# Patient Record
Sex: Female | Born: 1980 | Race: White | Hispanic: No | Marital: Married | Smoking: Current every day smoker
Health system: Southern US, Community
[De-identification: ages and names within clinical notes are randomized; demographics above are authoritative.]

## PROBLEM LIST (undated history)

## (undated) DIAGNOSIS — Z8041 Family history of malignant neoplasm of ovary: Secondary | ICD-10-CM

## (undated) DIAGNOSIS — G43909 Migraine, unspecified, not intractable, without status migrainosus: Secondary | ICD-10-CM

## (undated) DIAGNOSIS — F32A Depression, unspecified: Secondary | ICD-10-CM

## (undated) DIAGNOSIS — Z8719 Personal history of other diseases of the digestive system: Secondary | ICD-10-CM

## (undated) DIAGNOSIS — J40 Bronchitis, not specified as acute or chronic: Secondary | ICD-10-CM

## (undated) DIAGNOSIS — K760 Fatty (change of) liver, not elsewhere classified: Secondary | ICD-10-CM

## (undated) DIAGNOSIS — F329 Major depressive disorder, single episode, unspecified: Secondary | ICD-10-CM

## (undated) DIAGNOSIS — D649 Anemia, unspecified: Secondary | ICD-10-CM

## (undated) DIAGNOSIS — K219 Gastro-esophageal reflux disease without esophagitis: Secondary | ICD-10-CM

## (undated) HISTORY — PX: ABDOMINAL HYSTERECTOMY: SHX81

## (undated) HISTORY — DX: Family history of malignant neoplasm of ovary: Z80.41

## (undated) HISTORY — PX: DILATION AND CURETTAGE OF UTERUS: SHX78

## (undated) HISTORY — PX: WISDOM TOOTH EXTRACTION: SHX21

## (undated) HISTORY — PX: PARTIAL HYSTERECTOMY: SHX80

---

## 2004-08-28 ENCOUNTER — Emergency Department: Payer: Self-pay | Admitting: General Practice

## 2004-12-03 ENCOUNTER — Emergency Department: Payer: Self-pay | Admitting: General Practice

## 2005-10-14 ENCOUNTER — Emergency Department: Payer: Self-pay | Admitting: Emergency Medicine

## 2006-03-28 ENCOUNTER — Ambulatory Visit: Payer: Self-pay | Admitting: Internal Medicine

## 2006-09-21 ENCOUNTER — Emergency Department: Payer: Self-pay | Admitting: Emergency Medicine

## 2008-02-15 ENCOUNTER — Ambulatory Visit: Payer: Self-pay | Admitting: Unknown Physician Specialty

## 2008-04-05 ENCOUNTER — Ambulatory Visit: Payer: Self-pay | Admitting: Unknown Physician Specialty

## 2010-07-21 ENCOUNTER — Emergency Department: Payer: Self-pay | Admitting: Emergency Medicine

## 2012-07-28 ENCOUNTER — Ambulatory Visit: Payer: Self-pay | Admitting: Unknown Physician Specialty

## 2013-05-06 DIAGNOSIS — Z803 Family history of malignant neoplasm of breast: Secondary | ICD-10-CM

## 2013-05-06 DIAGNOSIS — Z1371 Encounter for nonprocreative screening for genetic disease carrier status: Secondary | ICD-10-CM

## 2013-05-06 HISTORY — DX: Family history of malignant neoplasm of breast: Z80.3

## 2013-05-06 HISTORY — DX: Encounter for nonprocreative screening for genetic disease carrier status: Z13.71

## 2013-05-24 ENCOUNTER — Emergency Department: Payer: Self-pay | Admitting: Emergency Medicine

## 2013-05-24 LAB — COMPREHENSIVE METABOLIC PANEL
ALT: 41 U/L (ref 12–78)
ANION GAP: 8 (ref 7–16)
AST: 34 U/L (ref 15–37)
Albumin: 4.1 g/dL (ref 3.4–5.0)
Alkaline Phosphatase: 90 U/L
BUN: 10 mg/dL (ref 7–18)
Bilirubin,Total: 0.4 mg/dL (ref 0.2–1.0)
CHLORIDE: 107 mmol/L (ref 98–107)
Calcium, Total: 9.9 mg/dL (ref 8.5–10.1)
Co2: 22 mmol/L (ref 21–32)
Creatinine: 0.96 mg/dL (ref 0.60–1.30)
GLUCOSE: 102 mg/dL — AB (ref 65–99)
Osmolality: 273 (ref 275–301)
POTASSIUM: 4.4 mmol/L (ref 3.5–5.1)
SODIUM: 137 mmol/L (ref 136–145)
Total Protein: 8.7 g/dL — ABNORMAL HIGH (ref 6.4–8.2)

## 2013-05-24 LAB — URINALYSIS, COMPLETE
Bilirubin,UR: NEGATIVE
GLUCOSE, UR: NEGATIVE mg/dL (ref 0–75)
Nitrite: NEGATIVE
Ph: 5 (ref 4.5–8.0)
Protein: 100
RBC,UR: 754 /HPF (ref 0–5)
Specific Gravity: 1.027 (ref 1.003–1.030)
Squamous Epithelial: 17
WBC UR: 30 /HPF (ref 0–5)

## 2013-05-24 LAB — CBC WITH DIFFERENTIAL/PLATELET
BASOS ABS: 0 10*3/uL (ref 0.0–0.1)
BASOS PCT: 0.3 %
EOS ABS: 0.1 10*3/uL (ref 0.0–0.7)
Eosinophil %: 0.8 %
HCT: 44.4 % (ref 35.0–47.0)
HGB: 14.9 g/dL (ref 12.0–16.0)
LYMPHS ABS: 2.5 10*3/uL (ref 1.0–3.6)
Lymphocyte %: 18 %
MCH: 28.8 pg (ref 26.0–34.0)
MCHC: 33.7 g/dL (ref 32.0–36.0)
MCV: 86 fL (ref 80–100)
Monocyte #: 0.6 x10 3/mm (ref 0.2–0.9)
Monocyte %: 4.7 %
Neutrophil #: 10.4 10*3/uL — ABNORMAL HIGH (ref 1.4–6.5)
Neutrophil %: 76.2 %
Platelet: 303 10*3/uL (ref 150–440)
RBC: 5.18 10*6/uL (ref 3.80–5.20)
RDW: 14 % (ref 11.5–14.5)
WBC: 13.7 10*3/uL — ABNORMAL HIGH (ref 3.6–11.0)

## 2013-12-20 ENCOUNTER — Ambulatory Visit: Payer: Self-pay

## 2014-10-13 ENCOUNTER — Other Ambulatory Visit: Payer: Self-pay | Admitting: Family Medicine

## 2014-10-13 DIAGNOSIS — L723 Sebaceous cyst: Secondary | ICD-10-CM

## 2014-10-19 ENCOUNTER — Ambulatory Visit: Admission: RE | Admit: 2014-10-19 | Payer: 59 | Source: Ambulatory Visit

## 2014-10-24 ENCOUNTER — Ambulatory Visit: Payer: 59

## 2014-11-02 ENCOUNTER — Ambulatory Visit: Payer: 59

## 2017-01-02 DIAGNOSIS — J029 Acute pharyngitis, unspecified: Secondary | ICD-10-CM | POA: Insufficient documentation

## 2017-01-02 NOTE — ED Triage Notes (Signed)
Patient c/o sore throat beginning today. Pt has taken tylenol with no relief, last dose at 1800.

## 2017-01-03 ENCOUNTER — Emergency Department
Admission: EM | Admit: 2017-01-03 | Discharge: 2017-01-03 | Disposition: A | Discharge status: Home / Self Care | Payer: BLUE CROSS/BLUE SHIELD | Attending: Emergency Medicine | Admitting: Emergency Medicine

## 2017-01-03 DIAGNOSIS — J020 Streptococcal pharyngitis: Secondary | ICD-10-CM

## 2017-01-03 HISTORY — DX: Depression, unspecified: F32.A

## 2017-01-03 HISTORY — DX: Migraine, unspecified, not intractable, without status migrainosus: G43.909

## 2017-01-03 HISTORY — DX: Major depressive disorder, single episode, unspecified: F32.9

## 2017-01-03 LAB — POCT RAPID STREP A: STREPTOCOCCUS, GROUP A SCREEN (DIRECT): NEGATIVE

## 2017-01-03 MED ORDER — PENICILLIN G BENZATHINE & PROC 1200000 UNIT/2ML IM SUSP
2.4000 10*6.[IU] | Freq: Once | INTRAMUSCULAR | Status: AC
  Administered 2017-01-03: 2.4 10*6.[IU] via INTRAMUSCULAR
  Filled 2017-01-03: qty 4

## 2017-01-03 MED ORDER — IBUPROFEN 800 MG PO TABS: 800.0000 mg | ORAL_TABLET | Freq: Three times a day (TID) | ORAL | 0 refills | Status: DC | PRN

## 2017-01-03 MED ORDER — KETOROLAC TROMETHAMINE 10 MG PO TABS
10.0000 mg | ORAL_TABLET | Freq: Once | ORAL | Status: AC
  Administered 2017-01-03: 10 mg via ORAL
  Filled 2017-01-03: qty 1

## 2017-01-03 MED ORDER — AMOXICILLIN-POT CLAVULANATE 875-125 MG PO TABS: 1.0000 | ORAL_TABLET | Freq: Two times a day (BID) | ORAL | 0 refills | Status: AC

## 2017-01-03 MED ORDER — LIDOCAINE VISCOUS 2 % MT SOLN
15.0000 mL | Freq: Once | OROMUCOSAL | Status: AC
  Administered 2017-01-03: 15 mL via OROMUCOSAL
  Filled 2017-01-03: qty 15

## 2017-01-03 NOTE — ED Provider Notes (Signed)
[facility] Emergency Department Provider Note    First MD Initiated Contact with Patient 01/03/17 [phone]     (approximate)  I have reviewed the triage vital signs and the nursing notes.   HISTORY  Chief Complaint Sore Throat    HPI Valerie Massey is a 36 y.o. female presents to the emergency department with sore throat bilateral neck pain with onset yesterday.patient states that the pain is worse with any swallowing. Patient denies any fever afebrile on presentation temperature 90.9. Patient denies any vomiting. Patient denies any cough or congestion.   Past Medical History:  Diagnosis Date  . Depression   . Migraines     There are no active problems to display for this patient.   Past Surgical History:  Procedure Laterality Date  . WISDOM TOOTH EXTRACTION      Prior to Admission medications   Not on File    Allergies Bee venom  No family history on file.  Social History Social History  Substance Use Topics  . Smoking status: Not on file  . Smokeless tobacco: Not on file  . Alcohol use Not on file    Review of Systems Constitutional: No fever/chills Eyes: No visual changes. ENT: Positive for sore throat. Cardiovascular: Denies chest pain. Respiratory: Denies shortness of breath. Gastrointestinal: No abdominal pain.  No nausea, no vomiting.  No diarrhea.  No constipation. Genitourinary: Negative for dysuria. Musculoskeletal: Negative for neck pain.  Negative for back pain. Integumentary: Negative for rash. Neurological: Negative for headaches, focal weakness or numbness.   ____________________________________________   PHYSICAL EXAM:  VITAL SIGNS: ED Triage Vitals  Enc Vitals Group     BP 01/02/17 2323 (!) 156/84     Pulse Rate 01/02/17 2323 71     Resp 01/02/17 2323 16     Temp 01/02/17 2323 99 F (37.2 C)     Temp Source 01/02/17 2323 Oral     SpO2 01/02/17 2323 95 %     Weight 01/02/17 2324 97.5 kg (215 lb)     Height 01/02/17 2324 1.676 m (5\' 6" )     Head Circumference --      Peak Flow --      Pain Score 01/02/17 2327 5     Pain Loc --      Pain Edu? --    Constitutional: Alert and oriented. Well appearing and in no acute distress. Eyes: Conjunctivae are normal.  Head: Atraumatic. Mouth/Throat: Mucous membranes are moist.bilateral tonsillar erythema with exudate. Normal non-muffled speech Neck: No stridor. Palpable submandibular and anterior cervical lymphadenopathy. Cardiovascular: Normal rate, regular rhythm. Good peripheral circulation. Grossly normal heart sounds. Respiratory: Normal respiratory effort.  No retractions. Lungs CTAB. Gastrointestinal: Soft and nontender. No distention.  Musculoskeletal: No lower extremity tenderness nor edema. No gross deformities of extremities. Neurologic:  Normal speech and language. No gross focal neurologic deficits are appreciated.  Skin:  Skin is warm, dry and intact. No rash noted.   ____________________________________________   LABS (all labs ordered are listed, but only abnormal results are displayed)  Labs Reviewed  CULTURE, GROUP A STREP [facility])  POCT RAPID STREP A   ____________________________________________   Procedures   ____________________________________________   INITIAL IMPRESSION / ASSESSMENT AND PLAN / ED COURSE  Pertinent labs & imaging results that were available during my care of the patient were reviewed by me and considered in my medical decision making (see chart for details).  36 year old female presenting with history of physical exam consistent with strep  throat. Patient received penicillin G emergency department will be prescribed Augmentin for home.      ____________________________________________  FINAL CLINICAL IMPRESSION(S) / ED DIAGNOSES  Final diagnoses:  Strep throat     MEDICATIONS GIVEN DURING THIS VISIT:  Medications  penicillin g procaine-penicillin g benzathine (BICILLIN-CR)  injection 600000-600000 units (2.4 Million Units Intramuscular Given 01/03/17 0344)  lidocaine (XYLOCAINE) 2 % viscous mouth solution 15 mL (15 mLs Mouth/Throat Given 01/03/17 0343)  ketorolac (TORADOL) tablet 10 mg (10 mg Oral Given 01/03/17 0343)     NEW OUTPATIENT MEDICATIONS STARTED DURING THIS VISIT:  New Prescriptions   No medications on file    Modified Medications   No medications on file    Discontinued Medications   No medications on file     Note:  This document was prepared using Dragon voice recognition software and may include unintentional dictation errors.    Gregor Hams, MD 01/03/17 [phone]

## 2017-01-03 NOTE — ED Notes (Signed)
ED Provider at bedside. 

## 2017-01-03 NOTE — ED Notes (Signed)

## 2017-01-05 LAB — CULTURE, GROUP A STREP (THRC)

## 2017-01-28 LAB — HM PAP SMEAR: HM PAP: NEGATIVE

## 2017-11-07 ENCOUNTER — Emergency Department: Payer: Self-pay

## 2017-11-07 ENCOUNTER — Emergency Department
Admission: EM | Admit: 2017-11-07 | Discharge: 2017-11-08 | Disposition: A | Discharge status: Home / Self Care | Payer: Self-pay | Attending: Emergency Medicine | Admitting: Emergency Medicine

## 2017-11-07 ENCOUNTER — Other Ambulatory Visit: Payer: Self-pay

## 2017-11-07 DIAGNOSIS — M545 Low back pain, unspecified: Secondary | ICD-10-CM

## 2017-11-07 DIAGNOSIS — W109XXA Fall (on) (from) unspecified stairs and steps, initial encounter: Secondary | ICD-10-CM | POA: Insufficient documentation

## 2017-11-07 DIAGNOSIS — S29012A Strain of muscle and tendon of back wall of thorax, initial encounter: Secondary | ICD-10-CM | POA: Insufficient documentation

## 2017-11-07 DIAGNOSIS — S322XXA Fracture of coccyx, initial encounter for closed fracture: Secondary | ICD-10-CM | POA: Insufficient documentation

## 2017-11-07 DIAGNOSIS — Y9301 Activity, walking, marching and hiking: Secondary | ICD-10-CM | POA: Insufficient documentation

## 2017-11-07 DIAGNOSIS — Y929 Unspecified place or not applicable: Secondary | ICD-10-CM | POA: Insufficient documentation

## 2017-11-07 DIAGNOSIS — Y999 Unspecified external cause status: Secondary | ICD-10-CM | POA: Insufficient documentation

## 2017-11-07 LAB — POCT PREGNANCY, URINE: Preg Test, Ur: NEGATIVE

## 2017-11-07 NOTE — ED Triage Notes (Signed)
Pt states she fell down 3 steps approx 45 min pta injuring lower and mid back. Pt cms intact to toes. Pt states she believes she has bruising to back. Pt complains of pain with sitting.

## 2017-11-07 NOTE — ED Notes (Addendum)
Pt still unable to urinate, but reports will attempt att

## 2017-11-07 NOTE — ED Provider Notes (Signed)
[facility] Emergency Department Provider Note ____________________________________________  Time seen: Approximately 10:35 PM  I have reviewed the triage vital signs and the nursing notes.   HISTORY  Chief Complaint Fall    HPI Valerie Massey is a 37 y.o. female who presents to the emergency department for evaluation and treatment of lower and mid back pain aftera mechanical, non-syncopal fall earlier this evening.  She states that she was running out of the house and the stairs were slick, she slipped and her feet went out from underneath her and she landed on her back.  The middle part of her back struck while the stairs and then she bounced down the next step on her buttocks.  No alleviating measures have been attempted prior to arrival.  She did not have any loss of consciousness.  She has had no subsequent loss of bowel or bladder control.  Past Medical History:  Diagnosis Date  . Depression   . Migraines     There are no active problems to display for this patient.   Past Surgical History:  Procedure Laterality Date  . WISDOM TOOTH EXTRACTION      Prior to Admission medications   Medication Sig Start Date End Date Taking? Authorizing Provider  cyclobenzaprine (FLEXERIL) 10 MG tablet Take 1 tablet (10 mg total) by mouth 3 (three) times daily as needed for muscle spasms. 11/08/17   Grethel Zenk B, FNP  ibuprofen (ADVIL,MOTRIN) 800 MG tablet Take 1 tablet (800 mg total) by mouth every 8 (eight) hours as needed. 01/03/17   Gregor Hams, MD  naproxen (NAPROSYN) 500 MG tablet Take 1 tablet (500 mg total) by mouth 2 (two) times daily with a meal. 11/08/17   Laurene Melendrez B, FNP    Allergies Bee venom  No family history on file.  Social History Social History   Tobacco Use  . Smoking status: Not on file  Substance Use Topics  . Alcohol use: Not on file  . Drug use: Not on file    Review of Systems Constitutional: Negative for  fever. Cardiovascular: Negative for chest pain. Respiratory: Negative for shortness of breath. Musculoskeletal: Positive for back pain Skin: Positive for abrasion Neurological: Negative for decrease in sensation  ____________________________________________   PHYSICAL EXAM:  VITAL SIGNS: ED Triage Vitals  Enc Vitals Group     BP 11/07/17 2202 136/85     Pulse Rate 11/07/17 2202 100     Resp 11/07/17 2202 16     Temp 11/07/17 2202 98.7 F (37.1 C)     Temp Source 11/07/17 2202 Oral     SpO2 11/07/17 2202 96 %     Weight 11/07/17 2203 260 lb (117.9 kg)     Height 11/07/17 2203 5\' 6"  (1.676 m)     Head Circumference --      Peak Flow --      Pain Score 11/07/17 2203 8     Pain Loc --      Pain Edu? --      Excl. in [place]? --     Constitutional: Alert and oriented. Well appearing and in no acute distress. Eyes: Conjunctivae are clear without discharge or drainage Head: Atraumatic Neck: Supple.  Nexus criteria is negative. Respiratory: No cough. Respirations are even and unlabored. Musculoskeletal: Diffuse thoracic, lumbar, and sacral tenderness without focal bony abnormality. Neurologic: Awake, alert, oriented x4.  Motor and sensory function is intact. Skin: Horizontal abrasion consistent with patient's report of injury on the thoracic  Psychiatric: Affect and behavior are appropriate.  ____________________________________________   LABS (all labs ordered are listed, but only abnormal results are displayed)  Labs Reviewed  POCT PREGNANCY, URINE  POC URINE PREG, ED   ____________________________________________  RADIOLOGY  Images of the thoracic and lumbar spine are negative for acute bony abnormality.  Image of the sacrum and coccyx show a subtle distal coccyx fracture. ____________________________________________   PROCEDURES  Procedures  ____________________________________________   INITIAL IMPRESSION / ASSESSMENT AND PLAN / ED COURSE  Valerie OLLIS  is a 37 y.o. who presents to the emergency department for treatment and evaluation after a mechanical, non-syncopal fall earlier today.  Images and exam are consistent.  She will be treated with Flexeril and Naprosyn for a subtle coccyx fracture.  She was provided a work note for the next 2 days as well.  She is to follow-up with the primary care provider for symptoms that are not improving over the next week or so.  She was advised to return to the emergency department for symptoms that change or worsen if unable to schedule an appointment.  Medications  cyclobenzaprine (FLEXERIL) tablet 10 mg (10 mg Oral Given 11/08/17 0021)  naproxen (NAPROSYN) tablet 500 mg (500 mg Oral Given 11/08/17 0115)    Pertinent labs & imaging results that were available during my care of the patient were reviewed by me and considered in my medical decision making (see chart for details).  _________________________________________   FINAL CLINICAL IMPRESSION(S) / ED DIAGNOSES  Final diagnoses:  Acute lumbar back pain  Fracture of coccyx, initial encounter for closed fracture ([place])  Strain of thoracic back region    ED Discharge Orders        Ordered    cyclobenzaprine (FLEXERIL) 10 MG tablet  3 times daily PRN     11/08/17 0020    naproxen (NAPROSYN) 500 MG tablet  2 times daily with meals     11/08/17 0020       If controlled substance prescribed during this visit, 12 month history viewed on the [place] prior to issuing an initial prescription for Schedule II or III opiod.    Victorino Dike, FNP 11/08/17 0030    Nance Pear, MD 11/08/17 1501

## 2017-11-08 MED ORDER — NAPROXEN 500 MG PO TABS
500.0000 mg | ORAL_TABLET | Freq: Once | ORAL | Status: AC
  Administered 2017-11-08: 500 mg via ORAL

## 2017-11-08 MED ORDER — CYCLOBENZAPRINE HCL 10 MG PO TABS: 10.0000 mg | ORAL_TABLET | Freq: Three times a day (TID) | ORAL | 0 refills | Status: DC | PRN

## 2017-11-08 MED ORDER — NAPROXEN 500 MG PO TABS
ORAL_TABLET | ORAL | Status: AC
  Administered 2017-11-08: 500 mg via ORAL
  Filled 2017-11-08: qty 1

## 2017-11-08 MED ORDER — NAPROXEN 500 MG PO TABS: 500.0000 mg | ORAL_TABLET | Freq: Two times a day (BID) | ORAL | 0 refills | Status: DC

## 2017-11-08 MED ORDER — CYCLOBENZAPRINE HCL 10 MG PO TABS
ORAL_TABLET | ORAL | Status: AC
  Administered 2017-11-08: 10 mg via ORAL
  Filled 2017-11-08: qty 1

## 2017-11-08 MED ORDER — CYCLOBENZAPRINE HCL 10 MG PO TABS
10.0000 mg | ORAL_TABLET | Freq: Once | ORAL | Status: AC
  Administered 2017-11-08: 10 mg via ORAL

## 2017-11-08 NOTE — Discharge Instructions (Addendum)
Ice 20 minutes per hour while awake. Do not take the flexeril if you are working or driving. Follow up with the PCP in 1 week if not improving. Return to the ER for symptoms that change or worsen or for new concerns if unable to schedule an appointment.

## 2017-11-08 NOTE — ED Notes (Signed)
Pt returned from DG att 

## 2018-01-28 ENCOUNTER — Ambulatory Visit (INDEPENDENT_AMBULATORY_CARE_PROVIDER_SITE_OTHER): Payer: Self-pay | Admitting: Obstetrics and Gynecology

## 2018-01-28 ENCOUNTER — Encounter: Payer: Self-pay | Admitting: Obstetrics and Gynecology

## 2018-01-28 ENCOUNTER — Other Ambulatory Visit: Payer: Self-pay

## 2018-01-28 VITALS — BP 140/80 | HR 96 | Ht 66.0 in | Wt 249.0 lb

## 2018-01-28 DIAGNOSIS — Z124 Encounter for screening for malignant neoplasm of cervix: Secondary | ICD-10-CM

## 2018-01-28 DIAGNOSIS — Z1331 Encounter for screening for depression: Secondary | ICD-10-CM

## 2018-01-28 DIAGNOSIS — Z3202 Encounter for pregnancy test, result negative: Secondary | ICD-10-CM

## 2018-01-28 DIAGNOSIS — Z01419 Encounter for gynecological examination (general) (routine) without abnormal findings: Secondary | ICD-10-CM

## 2018-01-28 DIAGNOSIS — Z1339 Encounter for screening examination for other mental health and behavioral disorders: Secondary | ICD-10-CM

## 2018-01-28 DIAGNOSIS — N939 Abnormal uterine and vaginal bleeding, unspecified: Secondary | ICD-10-CM

## 2018-01-28 DIAGNOSIS — Z113 Encounter for screening for infections with a predominantly sexual mode of transmission: Secondary | ICD-10-CM

## 2018-01-28 DIAGNOSIS — N921 Excessive and frequent menstruation with irregular cycle: Secondary | ICD-10-CM

## 2018-01-28 LAB — POCT URINE PREGNANCY: Preg Test, Ur: NEGATIVE

## 2018-01-28 LAB — HM PAP SMEAR: HM Pap smear: NORMAL

## 2018-01-28 MED ORDER — MEDROXYPROGESTERONE ACETATE 10 MG PO TABS: 10.0000 mg | ORAL_TABLET | Freq: Every day | ORAL | 0 refills | Status: DC

## 2018-01-28 NOTE — Progress Notes (Signed)
Gynecology Annual Exam  PCP: [facility], [facility]  Chief Complaint  Patient presents with  . Gynecologic Exam    abnormal bleeding x1 month   History of Present Illness:  Ms. Valerie Massey is a 37 y.o. G0P0000 who LMP was No LMP recorded., presents today for her annual examination.  Her menses are irregular. She bleeds most days.  Dysmenorrhea none. She notes bleeding after intercourse and the last time it was severe. This occurred last week.  Of note, the patient had an endometrial biopsy in 2016 that showed a benign endometrial polyp and was otherwise benign.  The polyp has not been follow up since that time.   She is single partner, contraception - none.  Last Pap: 11/2013  Results were: no abnormalities /neg HPV DNA negative Hx of STDs: none  Last mammogram: n/a There is a FH of breast cancer in her mother at age 63. There is a FH of ovarian cancer ("borderline"). Her maternal grandmother had endometrial cancer, age unsure. The patient does do self-breast exams.  Tobacco use: smokes 1/2 ppd for the past 20 years. Alcohol use: social drinker Exercise: not active  The patient wears seatbelts: yes.      Past Medical History:  Diagnosis Date  . Depression   . Migraines    Past Surgical History:  Procedure Laterality Date  . WISDOM TOOTH EXTRACTION     Prior to Admission medications   Medication Sig Start Date End Date Taking? Authorizing Provider  acetaminophen (TYLENOL) 500 MG tablet Take 500 mg by mouth every 6 (six) hours as needed for headache.   Yes [provider]  omeprazole (PRILOSEC) 40 MG capsule Take 40 mg by mouth daily.   Yes [provider]    Allergies  Allergen Reactions  . Bee Venom Hives and Swelling    Gynecologic History: unsure  Obstetric History: G0P0000  Social History   Socioeconomic History  . Marital status: Single    Spouse name: Not on file  . Number of children: Not on file  . Years of education:  Not on file  . Highest education level: Not on file  Occupational History  . Occupation: Investment banker, operational  . Financial resource strain: Not on file  . Food insecurity:    Worry: Not on file    Inability: Not on file  . Transportation needs:    Medical: Not on file    Non-medical: Not on file  Tobacco Use  . Smoking status: Current Every Day Smoker    Packs/day: 0.50    Types: Cigarettes    Start date: 05/06/1997  . Smokeless tobacco: Never Used  Substance and Sexual Activity  . Alcohol use: Yes    Comment: 1 q month  . Drug use: Never  . Sexual activity: Yes    Partners: Male    Birth control/protection: None  Lifestyle  . Physical activity:    Days per week: 0 days    Minutes per session: Not on file  . Stress: Not on file  Relationships  . Social connections:    Talks on phone: Not on file    Gets together: Not on file    Attends religious service: Not on file    Active member of club or organization: Not on file    Attends meetings of clubs or organizations: Not on file    Relationship status: Not on file  . Intimate partner violence:    Fear of current or ex  partner: Not on file    Emotionally abused: Not on file    Physically abused: Not on file    Forced sexual activity: Not on file  Other Topics Concern  . Not on file  Social History Narrative  . Not on file    Family History  Problem Relation Age of Onset  . Breast cancer Mother 66  . Ovarian cancer Mother 82  . Endometrial cancer Maternal Grandmother 23  . Lung cancer Maternal Grandfather 60  . Lung cancer Paternal Grandfather 49    Review of Systems  Constitutional: Negative.   HENT: Negative.   Eyes: Negative.   Respiratory: Negative.   Cardiovascular: Negative.   Gastrointestinal: Negative.   Genitourinary: Negative.   Musculoskeletal: Negative.   Skin: Negative.   Neurological: Negative.   Psychiatric/Behavioral: Negative.      Physical Exam BP 140/80 (BP Location: Left Arm,  Patient Position: Sitting, Cuff Size: Normal)   Pulse 96   Ht 5\' 6"  (1.676 m)   Wt 249 lb (112.9 kg)   BMI 40.19 kg/m    Physical Exam  Constitutional: She is oriented to person, place, and time. She appears well-developed and well-nourished. No distress.  Genitourinary: Uterus normal. Pelvic exam was performed with patient supine. There is no rash, tenderness, lesion or injury on the right labia. There is no rash, tenderness, lesion or injury on the left labia. There is bleeding in the vagina. No erythema or tenderness in the vagina. No signs of injury around the vagina. No vaginal discharge found. Right adnexum does not display mass, does not display tenderness and does not display fullness. Left adnexum does not display mass, does not display tenderness and does not display fullness. Cervix does not exhibit motion tenderness, lesion, discharge or polyp.   Uterus is mobile and anteverted. Uterus is not enlarged, tender or exhibiting a mass.  HENT:  Head: Normocephalic and atraumatic.  Eyes: EOM are normal. No scleral icterus.  Neck: Normal range of motion. Neck supple. No thyromegaly present.  Cardiovascular: Normal rate and regular rhythm. Exam reveals no gallop and no friction rub.  No murmur heard. Pulmonary/Chest: Effort normal and breath sounds normal. No respiratory distress. She has no wheezes. She has no rales. Right breast exhibits no inverted nipple, no mass, no nipple discharge, no skin change and no tenderness. Left breast exhibits no inverted nipple, no mass, no nipple discharge, no skin change and no tenderness.  Abdominal: Soft. Bowel sounds are normal. She exhibits no distension and no mass. There is no tenderness. There is no rebound and no guarding.  Musculoskeletal: Normal range of motion. She exhibits no edema or tenderness.  Lymphadenopathy:    She has no cervical adenopathy.       Right: No inguinal adenopathy present.       Left: No inguinal adenopathy present.   Neurological: She is alert and oriented to person, place, and time. No cranial nerve deficit.  Skin: Skin is warm and dry. No rash noted. No erythema.  Psychiatric: She has a normal mood and affect. Her behavior is normal. Judgment normal.   Endometrial Biopsy After discussion with the patient regarding her abnormal uterine bleeding I recommended that she proceed with an endometrial biopsy for further diagnosis. The risks, benefits, alternatives, and indications for an endometrial biopsy were discussed with the patient in detail. She understood the risks including infection, bleeding, cervical laceration and uterine perforation.  Verbal consent was obtained.   PROCEDURE NOTE:  Pipelle endometrial biopsy was performed  using aseptic technique with iodine preparation.  The uterus was sounded to a length of 8 cm.  Adequate sampling was obtained with minimal blood loss.  The patient tolerated the procedure well.  Disposition will be pending pathology.  Female chaperone present for pelvic and breast  portions of the physical exam  Assessment: 37 y.o. G0P0000 female here for routine annual gynecologic examination.  Plan: Problem List Items Addressed This Visit      Other   Menorrhagia with irregular cycle - Primary   Relevant Medications   medroxyPROGESTERone (PROVERA) 10 MG tablet   Other Relevant Orders   US PELVIS TRANSVANGINAL NON-OB (TV ONLY)   Pathology    Other Visit Diagnoses    Abnormal uterine bleeding       Relevant Medications   medroxyPROGESTERone (PROVERA) 10 MG tablet   Other Relevant Orders   POCT urine pregnancy (Completed)   US PELVIS TRANSVANGINAL NON-OB (TV ONLY)   Pathology   Pap smear for cervical cancer screening       Screen for STD (sexually transmitted disease)       Women's annual routine gynecological examination       Screening for depression       Screening for alcoholism          Screening: -- Blood pressure screen elevated: continued to  monitor. -- Colonoscopy - not due -- Mammogram - not due -- Weight screening: obese: discussed management options, including lifestyle, dietary, and exercise. -- Depression screening negative (PHQ-9) -- Nutrition: normal -- cholesterol screening: not due for screening -- osteoporosis screening: not due -- tobacco screening: using: discussed quitting using the 5 A's -- alcohol screening: AUDIT questionnaire indicates low-risk usage. -- family history of breast cancer screening: done. not at high risk. -- no evidence of domestic violence or intimate partner violence. -- STD screening: gonorrhea/chlamydia NAAT collected -- pap smear collected per ASCCP guidelines  Menorrhagia with irregular cycle (abnormal uterine bleeding): endometrial biopsy taken today. Pap smear also performed.  Will obtain pelvic ultrasound. Discussed past pathology result.  She may still have an endometrial polyp.    20 minutes spent in face to face discussion with > 50% spent in counseling,management, and coordination of care of her menorrhagia with irregular cycle.   Prentice Docker, MD 01/28/2018 6:39 PM

## 2018-02-02 LAB — PATHOLOGY

## 2018-02-10 ENCOUNTER — Encounter: Payer: Self-pay | Admitting: Obstetrics and Gynecology

## 2018-02-10 ENCOUNTER — Ambulatory Visit (INDEPENDENT_AMBULATORY_CARE_PROVIDER_SITE_OTHER): Payer: Self-pay

## 2018-02-10 ENCOUNTER — Ambulatory Visit (INDEPENDENT_AMBULATORY_CARE_PROVIDER_SITE_OTHER): Payer: Self-pay | Admitting: Obstetrics and Gynecology

## 2018-02-10 VITALS — BP 118/78 | Ht 66.0 in | Wt 249.0 lb

## 2018-02-10 DIAGNOSIS — N939 Abnormal uterine and vaginal bleeding, unspecified: Secondary | ICD-10-CM

## 2018-02-10 DIAGNOSIS — N921 Excessive and frequent menstruation with irregular cycle: Secondary | ICD-10-CM

## 2018-02-10 DIAGNOSIS — N840 Polyp of corpus uteri: Secondary | ICD-10-CM

## 2018-02-10 MED ORDER — MEDROXYPROGESTERONE ACETATE 10 MG PO TABS: 10.0000 mg | ORAL_TABLET | Freq: Every day | ORAL | 0 refills | Status: DC

## 2018-02-10 NOTE — Progress Notes (Signed)
Gynecology Ultrasound Follow Up   Chief Complaint  Patient presents with  . Follow-up  ultrasound follow up for abnormal uterine bleeding   History of Present Illness: Patient is a 37 y.o. female who presents today for ultrasound evaluation of the above .  Ultrasound demonstrates the following findings Adnexa: no masses seen  Uterus: anteverted with endometrial stripe  15.6 mm Additional: small stalk of flow feeding into the endometrium, no obvious polyp seen.   Pap smear (MDL): NILM, HPV negative Endometrial biopsy: disordered proliferative phase endometrium. Fragments with features suggestive of benign endometrial polyp. No atypical hyperplasia or carcinoma.   Past Medical History:  Diagnosis Date  . Depression   . Migraines     Past Surgical History:  Procedure Laterality Date  . WISDOM TOOTH EXTRACTION      Family History  Problem Relation Age of Onset  . Breast cancer Mother 87  . Ovarian cancer Mother 38  . Endometrial cancer Maternal Grandmother 40  . Lung cancer Maternal Grandfather 60  . Lung cancer Paternal Grandfather 79    Social History   Socioeconomic History  . Marital status: Single    Spouse name: Not on file  . Number of children: Not on file  . Years of education: Not on file  . Highest education level: Not on file  Occupational History  . Occupation: Investment banker, operational  . Financial resource strain: Not on file  . Food insecurity:    Worry: Not on file    Inability: Not on file  . Transportation needs:    Medical: Not on file    Non-medical: Not on file  Tobacco Use  . Smoking status: Current Every Day Smoker    Packs/day: 0.50    Types: Cigarettes    Start date: 05/06/1997  . Smokeless tobacco: Never Used  Substance and Sexual Activity  . Alcohol use: Yes    Comment: 1 q month  . Drug use: Never  . Sexual activity: Yes    Partners: Male    Birth control/protection: None  Lifestyle  . Physical activity:    Days per week:  0 days    Minutes per session: Not on file  . Stress: Not on file  Relationships  . Social connections:    Talks on phone: Not on file    Gets together: Not on file    Attends religious service: Not on file    Active member of club or organization: Not on file    Attends meetings of clubs or organizations: Not on file    Relationship status: Not on file  . Intimate partner violence:    Fear of current or ex partner: Not on file    Emotionally abused: Not on file    Physically abused: Not on file    Forced sexual activity: Not on file  Other Topics Concern  . Not on file  Social History Narrative  . Not on file    Allergies  Allergen Reactions  . Bee Venom Hives and Swelling    Prior to Admission medications   Medication Sig Start Date End Date Taking? Authorizing Provider  acetaminophen (TYLENOL) 500 MG tablet Take 500 mg by mouth every 6 (six) hours as needed for headache.    [provider]  cyclobenzaprine (FLEXERIL) 10 MG tablet Take 1 tablet (10 mg total) by mouth 3 (three) times daily as needed for muscle spasms. Patient not taking: Reported on 01/28/2018 11/08/17   Victorino Dike, FNP  ibuprofen (ADVIL,MOTRIN) 800 MG tablet Take 1 tablet (800 mg total) by mouth every 8 (eight) hours as needed. Patient not taking: Reported on 01/28/2018 01/03/17   Gregor Hams, MD  medroxyPROGESTERone (PROVERA) 10 MG tablet Take 1 tablet (10 mg total) by mouth daily for 10 days. 01/28/18 02/07/18  Will Bonnet, MD  naproxen (NAPROSYN) 500 MG tablet Take 1 tablet (500 mg total) by mouth 2 (two) times daily with a meal. Patient not taking: Reported on 01/28/2018 11/08/17   Sherrie George B, FNP  omeprazole (PRILOSEC) 40 MG capsule Take 40 mg by mouth daily.    [provider]    Physical Exam BP 118/78   Ht 5\' 6"  (1.676 m)   Wt 249 lb (112.9 kg)   BMI 40.19 kg/m    General: NAD HEENT: normocephalic, anicteric Pulmonary: No increased work of  breathing Extremities: no edema, erythema, or tenderness Neurologic: Grossly intact, normal gait Psychiatric: mood appropriate, affect full  Imaging Results: US Pelvis Transvanginal Non-ob (tv Only)  Result Date: 02/10/2018 Patient Name: Valerie Massey DOB: 1981/01/30 MRN: [id number] ULTRASOUND REPORT Location: [facility] Date of Service: 02/10/2018 Indications: Abnormal Uterine Bleeding Findings: The uterus is anteverted and measures 6.8 x 4.7 x 4.1cm. Echo texture is homogenous without evidence of focal masses. The Endometrium is thickened with cystic areas within measuring 15.6 mm. Stalk of flow seen feeding into the endometrium. No obvious polyp seen, but cannot rule it out. Right Ovary measures 3.4 x 2.8 x 2.9 cm. It is normal in appearance. Left Ovary measures 3.1 x 3.0 x 1.8 cm. It is normal in appearance. Survey of the adnexa demonstrates no adnexal masses. There is no free fluid in the cul de sac. Impression: 1. Thickened, heterogeneous endometrium with small stalk of flow seen. No obvious polyp by ultrasound, but it cannot be ruled out. 2. Uterus otherwise unremarkable 3. Ovaries unremarkable. Vita Barley, RDMS RVT The ultrasound images and findings were reviewed by me and I agree with the above report. Prentice Docker, MD, [facility], [facility] 02/10/2018 4:06 PM       Assessment: 37 y.o. G0P0000  1. Menorrhagia with irregular cycle   2. Endometrial polyp      Plan: Problem List Items Addressed This Visit      Genitourinary   Endometrial polyp   Relevant Medications   medroxyPROGESTERone (PROVERA) 10 MG tablet     Other   Menorrhagia with irregular cycle - Primary   Relevant Medications   medroxyPROGESTERone (PROVERA) 10 MG tablet     Discussed findings from endometrial biopsy, Pap smear, and ultrasound.  Recommended surgery to remove very likely polyp.  She agrees with this recommendation and we will proceed.  20 minutes spent in face to  face discussion with > 50% spent in counseling,management, and coordination of care of her menorrhagia with irregular cycle and endometrial polyp.  Prentice Docker, MD, [facility], [facility] 02/10/2018 4:31 PM

## 2018-02-11 ENCOUNTER — Telehealth: Payer: Self-pay | Admitting: Obstetrics and Gynecology

## 2018-02-11 NOTE — Telephone Encounter (Signed)
-----   Message from Will Bonnet, MD sent at 02/10/2018  4:31 PM EDT ----- Regarding: Schedule surgery Surgery Booking Request Patient Full Name:  Valerie Massey  MRN: [id number]  DOB: [birth year]  Surgeon: Prentice Docker, MD  Requested Surgery Date and Time: TBD Primary Diagnosis AND Code: endometrial polyp, menorrhagia with irregular menses Secondary Diagnosis and Code:  Surgical Procedure: hysteroscopy, dilation and curettage, polypectomy L&D Notification: No Admission Status: same day surgery Length of Surgery: 35 minutes Special Case Needs: myosure H&P: tbd (date) Phone Interview???: no Interpreter: Language:  Medical Clearance: no Special Scheduling Instructions: none

## 2018-02-11 NOTE — Telephone Encounter (Signed)
Patient is aware of H&P at [facility] on 02/25/18 @ 10:50am w/ Dr. Glennon Mac, Pre-admit Testing afterwards, and OR on 03/12/18. Patient is aware she may receive calls from the [facility] and [facility]. Patient confirmed she is self pay. Patient was given the following phone#s: [phone] [facility] Patient [place], [phone] [phone] to apply for financial assistance within [facility] practices, and [place]. Ext given.

## 2018-02-19 ENCOUNTER — Telehealth: Payer: Self-pay

## 2018-02-19 NOTE — Telephone Encounter (Signed)
Pt states hydroxyprogesterone is not working for her bleeding.  States SDJ told her to let him know and maybe he could increase the dose.  [phone].

## 2018-02-20 ENCOUNTER — Other Ambulatory Visit: Payer: Self-pay | Admitting: Obstetrics and Gynecology

## 2018-02-20 DIAGNOSIS — N921 Excessive and frequent menstruation with irregular cycle: Secondary | ICD-10-CM

## 2018-02-20 DIAGNOSIS — N840 Polyp of corpus uteri: Secondary | ICD-10-CM

## 2018-02-20 MED ORDER — MEDROXYPROGESTERONE ACETATE 10 MG PO TABS: 20.0000 mg | ORAL_TABLET | Freq: Two times a day (BID) | ORAL | 0 refills | Status: DC

## 2018-02-25 ENCOUNTER — Encounter
Admission: RE | Admit: 2018-02-25 | Discharge: 2018-02-25 | Disposition: A | Discharge status: Home / Self Care | Payer: Self-pay | Source: Ambulatory Visit | Attending: Obstetrics and Gynecology | Admitting: Obstetrics and Gynecology

## 2018-02-25 ENCOUNTER — Other Ambulatory Visit: Payer: Self-pay

## 2018-02-25 ENCOUNTER — Ambulatory Visit (INDEPENDENT_AMBULATORY_CARE_PROVIDER_SITE_OTHER): Payer: Self-pay | Admitting: Obstetrics and Gynecology

## 2018-02-25 ENCOUNTER — Encounter: Payer: Self-pay | Admitting: Obstetrics and Gynecology

## 2018-02-25 VITALS — BP 122/78 | Ht 66.0 in | Wt 247.0 lb

## 2018-02-25 DIAGNOSIS — Z01812 Encounter for preprocedural laboratory examination: Secondary | ICD-10-CM | POA: Insufficient documentation

## 2018-02-25 DIAGNOSIS — N840 Polyp of corpus uteri: Secondary | ICD-10-CM

## 2018-02-25 DIAGNOSIS — N921 Excessive and frequent menstruation with irregular cycle: Secondary | ICD-10-CM

## 2018-02-25 HISTORY — DX: Personal history of other diseases of the digestive system: Z87.19

## 2018-02-25 HISTORY — DX: Anemia, unspecified: D64.9

## 2018-02-25 LAB — CBC
HCT: 39 % (ref 36.0–46.0)
Hemoglobin: 12.6 g/dL (ref 12.0–15.0)
MCH: 27.6 pg (ref 26.0–34.0)
MCHC: 32.3 g/dL (ref 30.0–36.0)
MCV: 85.5 fL (ref 80.0–100.0)
NRBC: 0 % (ref 0.0–0.2)
PLATELETS: 316 10*3/uL (ref 150–400)
RBC: 4.56 MIL/uL (ref 3.87–5.11)
RDW: 14.3 % (ref 11.5–15.5)
WBC: 9.1 10*3/uL (ref 4.0–10.5)

## 2018-02-25 NOTE — Progress Notes (Signed)
Preoperative History and Physical  Valerie Massey is a 37 y.o. G0P0000 here for surgical management of menorrhagia with irregular cycle and abnormal uterine bleeding.   No significant preoperative concerns.  History of Present Illness: 37 y.o. G0P0000 who LMP was No LMP recorded., presents today for her annual examination.  Her menses are irregular. She bleeds most days.  Dysmenorrhea none. She notes bleeding after intercourse and the last time it was severe. This occurred last week.  Of note, the patient had an endometrial biopsy in 2016 that showed a benign endometrial polyp and was otherwise benign.  The polyp has not been follow up since that time.   Pelvic ultrasound showed (02/10/2018):  Adnexa: no masses seen  Uterus: anteverted with endometrial stripe  15.6 mm Additional: small stalk of flow feeding into the endometrium, no obvious polyp seen.   Pap smear (MDL): NILM, HPV negative Endometrial biopsy: disordered proliferative phase endometrium. Fragments with features suggestive of benign endometrial polyp. No atypical hyperplasia or carcinoma.   Proposed surgery: Hysteroscopy, dilation and curettage, polypectomy  Past Medical History:  Diagnosis Date  . Depression   . Migraines    Past Surgical History:  Procedure Laterality Date  . WISDOM TOOTH EXTRACTION     OB History  Gravida Para Term Preterm AB Living  0 0 0 0 0 0  SAB TAB Ectopic Multiple Live Births  0 0 0 0 0  Patient denies any other pertinent gynecologic issues.   Current Outpatient Medications on File Prior to Visit  Medication Sig Dispense Refill  . acetaminophen (TYLENOL) 500 MG tablet Take 1,000 mg by mouth 2 (two) times daily as needed for headache.     . calcium carbonate (TUMS - DOSED IN MG ELEMENTAL CALCIUM) 500 MG chewable tablet Chew 2 tablets by mouth daily as needed for indigestion or heartburn.    Marland Kitchen ibuprofen (ADVIL,MOTRIN) 200 MG tablet Take 400 mg by mouth 2 (two) times daily as needed for  headache or moderate pain.    . medroxyPROGESTERone (PROVERA) 10 MG tablet Take 2 tablets (20 mg total) by mouth 2 (two) times daily. 40 tablet 0  . Multiple Vitamin (MULTIVITAMIN WITH MINERALS) TABS tablet Take 1 tablet by mouth once a week.    Marland Kitchen omeprazole (PRILOSEC) 40 MG capsule Take 40 mg by mouth daily.      No current facility-administered medications on file prior to visit.    Allergies  Allergen Reactions  . Bee Venom Hives and Swelling  . Other Nausea And Vomiting    scallops    Social History:   reports that she has been smoking cigarettes. She started smoking about 20 years ago. She has been smoking about 0.50 packs per day. She has never used smokeless tobacco. She reports that she drinks alcohol. She reports that she does not use drugs.  Family History  Problem Relation Age of Onset  . Breast cancer Mother 71  . Ovarian cancer Mother 33  . Endometrial cancer Maternal Grandmother 70  . Lung cancer Maternal Grandfather 60  . Lung cancer Paternal Grandfather 21    Review of Systems: Review of Systems  Constitutional: Negative.   HENT: Negative.   Eyes: Negative.   Respiratory: Negative.   Cardiovascular: Negative.   Gastrointestinal: Negative.   Genitourinary: Negative.   Musculoskeletal: Negative.   Skin: Negative.   Neurological: Negative.   Psychiatric/Behavioral: Negative.      PHYSICAL EXAM: Blood pressure 122/78, height 5\' 6"  (1.676 m), weight 247 lb (112 kg).  CONSTITUTIONAL: Well-developed, well-nourished female in no acute distress.  HENT:  Normocephalic, atraumatic, External right and left ear normal. Oropharynx is clear and moist EYES: Conjunctivae and EOM are normal. Pupils are equal, round, and reactive to light. No scleral icterus.  NECK: Normal range of motion, supple, no masses SKIN: Skin is warm and dry. No rash noted. Not diaphoretic. No erythema. No pallor. [place]: Alert and oriented to person, place, and time. Normal reflexes, muscle tone  coordination. No cranial nerve deficit noted. PSYCHIATRIC: Normal mood and affect. Normal behavior. Normal judgment and thought content. CARDIOVASCULAR: Normal heart rate noted, regular rhythm RESPIRATORY: Effort and breath sounds normal, no problems with respiration noted ABDOMEN: Soft, nontender, nondistended. PELVIC: Deferred MUSCULOSKELETAL: Normal range of motion. No edema and no tenderness. 2+ distal pulses.  Labs: No results found for this or any previous visit (from the past 336 hour(s)).  Imaging Studies: US Pelvis Transvanginal Non-ob (tv Only)  Result Date: 02/10/2018 Patient Name: Valerie Massey DOB: 1981/04/03 MRN: [id number] ULTRASOUND REPORT Location: [facility] Date of Service: 02/10/2018 Indications: Abnormal Uterine Bleeding Findings: The uterus is anteverted and measures 6.8 x 4.7 x 4.1cm. Echo texture is homogenous without evidence of focal masses. The Endometrium is thickened with cystic areas within measuring 15.6 mm. Stalk of flow seen feeding into the endometrium. No obvious polyp seen, but cannot rule it out. Right Ovary measures 3.4 x 2.8 x 2.9 cm. It is normal in appearance. Left Ovary measures 3.1 x 3.0 x 1.8 cm. It is normal in appearance. Survey of the adnexa demonstrates no adnexal masses. There is no free fluid in the cul de sac. Impression: 1. Thickened, heterogeneous endometrium with small stalk of flow seen. No obvious polyp by ultrasound, but it cannot be ruled out. 2. Uterus otherwise unremarkable 3. Ovaries unremarkable. Vita Barley, RDMS RVT The ultrasound images and findings were reviewed by me and I agree with the above report. Prentice Docker, MD, [facility], [facility] 02/10/2018 4:06 PM      Assessment: Patient Active Problem List   Diagnosis Date Noted  . Endometrial polyp 02/10/2018  . Menorrhagia with irregular cycle 01/28/2018    Plan: Patient will undergo surgical management with hysteroscopy, dilation and  curettage, polypectomy.   The risks of surgery were discussed in detail with the patient including but not limited to: bleeding which may require transfusion or reoperation; infection which may require antibiotics; injury to surrounding organs which may involve bowel, bladder, ureters ; need for additional procedures including laparoscopy or laparotomy; thromboembolic phenomenon, surgical site problems and other postoperative/anesthesia complications. Likelihood of success in alleviating the patient's condition was discussed. Routine postoperative instructions will be reviewed with the patient and her family in detail after surgery.  The patient concurred with the proposed plan, giving informed written consent for the surgery.  Preoperative prophylactic antibiotics, as indicated, and SCDs ordered on call to the OR.    Prentice Docker, MD 02/25/2018 10:49 AM

## 2018-02-25 NOTE — Patient Instructions (Signed)
Your procedure is scheduled on:03/12/18 Report to Day Surgery.[facility] SECOND FLOOR To find out your arrival time please call [phone] between 1PM - 3PM on 03/11/18  Remember: Instructions that are not followed completely may result in serious medical risk,  up to and including death, or upon the discretion of your surgeon and anesthesiologist your  surgery may need to be rescheduled.     _X__ 1. Do not eat food after midnight the night before your procedure.                 No gum chewing or hard candies. You may drink clear liquids up to 2 hours                 before you are scheduled to arrive for your surgery- DO not drink clear                 liquids within 2 hours of the start of your surgery.                 Clear Liquids include:  water, apple juice without pulp, clear carbohydrate                 drink such as Clearfast of Gatorade, Black Coffee or Tea (Do not add                 anything to coffee or tea).  __X__2.  On the morning of surgery brush your teeth with toothpaste and water, you                may rinse your mouth with mouthwash if you wish.  Do not swallow any toothpaste of mouthwash.     _X__ 3.  No Alcohol for 24 hours before or after surgery.   _X__ 4.  Do Not Smoke or use e-cigarettes For 24 Hours Prior to Your Surgery.                 Do not use any chewable tobacco products for at least 6 hours prior to                 surgery.  ____  5.  Bring all medications with you on the day of surgery if instructed.   _X___  6.  Notify your doctor if there is any change in your medical condition      (cold, fever, infections).     Do not wear jewelry, make-up, hairpins, clips or nail polish. Do not wear lotions, powders, or perfumes. You may wear deodorant. Do not shave 48 hours prior to surgery. Men may shave face and neck. Do not bring valuables to the hospital.    [facility] is not responsible for any belongings or  valuables.  Contacts, dentures or bridgework may not be worn into surgery. Leave your suitcase in the car. After surgery it may be brought to your room. For patients admitted to the hospital, discharge time is determined by your treatment team.   Patients discharged the day of surgery will not be allowed to drive  ___X_ Take these medicines the morning of surgery with A SIP OF WATER:    1.OMEPRAZOLE AT BEDTIME 03/11/18 AND MORNING OF SURGERY  2.   3.   4.  5.  6.  ____ Fleet Enema (as directed)   ____ Use CHG Soap as directed  ____ Use inhalers on the day of surgery  ____ Stop metformin 2 days prior to surgery  ____ Take 1/2 of usual insulin dose the night before surgery. No insulin the morning          of surgery.   ____ Stop Coumadin/Plavix/aspirin on  _X___ Stop Anti-inflammatories on       STOP IBUPROFEN 03/06/18  ____ Stop supplements until after surgery.    ____ Bring C-Pap to the hospital.

## 2018-02-25 NOTE — H&P (View-Only) (Signed)
Preoperative History and Physical  Valerie Massey is a 37 y.o. G0P0000 here for surgical management of menorrhagia with irregular cycle and abnormal uterine bleeding.   No significant preoperative concerns.  History of Present Illness: 37 y.o. G0P0000 who LMP was No LMP recorded., presents today for her annual examination.  Her menses are irregular. She bleeds most days.  Dysmenorrhea none. She notes bleeding after intercourse and the last time it was severe. This occurred last week.  Of note, the patient had an endometrial biopsy in 2016 that showed a benign endometrial polyp and was otherwise benign.  The polyp has not been follow up since that time.   Pelvic ultrasound showed (02/10/2018):  Adnexa: no masses seen  Uterus: anteverted with endometrial stripe  15.6 mm Additional: small stalk of flow feeding into the endometrium, no obvious polyp seen.   Pap smear (MDL): NILM, HPV negative Endometrial biopsy: disordered proliferative phase endometrium. Fragments with features suggestive of benign endometrial polyp. No atypical hyperplasia or carcinoma.   Proposed surgery: Hysteroscopy, dilation and curettage, polypectomy  Past Medical History:  Diagnosis Date  . Depression   . Migraines    Past Surgical History:  Procedure Laterality Date  . WISDOM TOOTH EXTRACTION     OB History  Gravida Para Term Preterm AB Living  0 0 0 0 0 0  SAB TAB Ectopic Multiple Live Births  0 0 0 0 0  Patient denies any other pertinent gynecologic issues.   Current Outpatient Medications on File Prior to Visit  Medication Sig Dispense Refill  . acetaminophen (TYLENOL) 500 MG tablet Take 1,000 mg by mouth 2 (two) times daily as needed for headache.     . calcium carbonate (TUMS - DOSED IN MG ELEMENTAL CALCIUM) 500 MG chewable tablet Chew 2 tablets by mouth daily as needed for indigestion or heartburn.    Marland Kitchen ibuprofen (ADVIL,MOTRIN) 200 MG tablet Take 400 mg by mouth 2 (two) times daily as needed for  headache or moderate pain.    . medroxyPROGESTERone (PROVERA) 10 MG tablet Take 2 tablets (20 mg total) by mouth 2 (two) times daily. 40 tablet 0  . Multiple Vitamin (MULTIVITAMIN WITH MINERALS) TABS tablet Take 1 tablet by mouth once a week.    Marland Kitchen omeprazole (PRILOSEC) 40 MG capsule Take 40 mg by mouth daily.      No current facility-administered medications on file prior to visit.    Allergies  Allergen Reactions  . Bee Venom Hives and Swelling  . Other Nausea And Vomiting    scallops    Social History:   reports that she has been smoking cigarettes. She started smoking about 20 years ago. She has been smoking about 0.50 packs per day. She has never used smokeless tobacco. She reports that she drinks alcohol. She reports that she does not use drugs.  Family History  Problem Relation Age of Onset  . Breast cancer Mother 23  . Ovarian cancer Mother 42  . Endometrial cancer Maternal Grandmother 67  . Lung cancer Maternal Grandfather 60  . Lung cancer Paternal Grandfather 32    Review of Systems: Review of Systems  Constitutional: Negative.   HENT: Negative.   Eyes: Negative.   Respiratory: Negative.   Cardiovascular: Negative.   Gastrointestinal: Negative.   Genitourinary: Negative.   Musculoskeletal: Negative.   Skin: Negative.   Neurological: Negative.   Psychiatric/Behavioral: Negative.      PHYSICAL EXAM: Blood pressure 122/78, height 5\' 6"  (1.676 m), weight 247 lb (112 kg).  CONSTITUTIONAL: Well-developed, well-nourished female in no acute distress.  HENT:  Normocephalic, atraumatic, External right and left ear normal. Oropharynx is clear and moist EYES: Conjunctivae and EOM are normal. Pupils are equal, round, and reactive to light. No scleral icterus.  NECK: Normal range of motion, supple, no masses SKIN: Skin is warm and dry. No rash noted. Not diaphoretic. No erythema. No pallor. [place]: Alert and oriented to person, place, and time. Normal reflexes, muscle tone  coordination. No cranial nerve deficit noted. PSYCHIATRIC: Normal mood and affect. Normal behavior. Normal judgment and thought content. CARDIOVASCULAR: Normal heart rate noted, regular rhythm RESPIRATORY: Effort and breath sounds normal, no problems with respiration noted ABDOMEN: Soft, nontender, nondistended. PELVIC: Deferred MUSCULOSKELETAL: Normal range of motion. No edema and no tenderness. 2+ distal pulses.  Labs: No results found for this or any previous visit (from the past 336 hour(s)).  Imaging Studies: US Pelvis Transvanginal Non-ob (tv Only)  Result Date: 02/10/2018 Patient Name: Valerie Massey DOB: 09/29/1980 MRN: [id number] ULTRASOUND REPORT Location: [facility] Date of Service: 02/10/2018 Indications: Abnormal Uterine Bleeding Findings: The uterus is anteverted and measures 6.8 x 4.7 x 4.1cm. Echo texture is homogenous without evidence of focal masses. The Endometrium is thickened with cystic areas within measuring 15.6 mm. Stalk of flow seen feeding into the endometrium. No obvious polyp seen, but cannot rule it out. Right Ovary measures 3.4 x 2.8 x 2.9 cm. It is normal in appearance. Left Ovary measures 3.1 x 3.0 x 1.8 cm. It is normal in appearance. Survey of the adnexa demonstrates no adnexal masses. There is no free fluid in the cul de sac. Impression: 1. Thickened, heterogeneous endometrium with small stalk of flow seen. No obvious polyp by ultrasound, but it cannot be ruled out. 2. Uterus otherwise unremarkable 3. Ovaries unremarkable. Vita Barley, RDMS RVT The ultrasound images and findings were reviewed by me and I agree with the above report. Prentice Docker, MD, [facility], [facility] 02/10/2018 4:06 PM      Assessment: Patient Active Problem List   Diagnosis Date Noted  . Endometrial polyp 02/10/2018  . Menorrhagia with irregular cycle 01/28/2018    Plan: Patient will undergo surgical management with hysteroscopy, dilation and  curettage, polypectomy.   The risks of surgery were discussed in detail with the patient including but not limited to: bleeding which may require transfusion or reoperation; infection which may require antibiotics; injury to surrounding organs which may involve bowel, bladder, ureters ; need for additional procedures including laparoscopy or laparotomy; thromboembolic phenomenon, surgical site problems and other postoperative/anesthesia complications. Likelihood of success in alleviating the patient's condition was discussed. Routine postoperative instructions will be reviewed with the patient and her family in detail after surgery.  The patient concurred with the proposed plan, giving informed written consent for the surgery.  Preoperative prophylactic antibiotics, as indicated, and SCDs ordered on call to the OR.    Prentice Docker, MD 02/25/2018 10:49 AM

## 2018-03-03 ENCOUNTER — Telehealth: Payer: Self-pay

## 2018-03-03 NOTE — Telephone Encounter (Signed)
Pt calling triage line stating she is having surgery next Thursday with SDJ for polyp removal. SDJ gave her Provera but pre-op did not put that on her list of meds she could take. She is bleeding and wanted to know if it was ok for her to take now and not mess the surgery up.

## 2018-03-04 NOTE — Telephone Encounter (Signed)
Please let me know when pt calls back or let her know it Is fine if she takes the provera

## 2018-03-12 ENCOUNTER — Ambulatory Visit: Payer: Self-pay | Admitting: Anesthesiology

## 2018-03-12 ENCOUNTER — Ambulatory Visit
Admission: RE | Admit: 2018-03-12 | Discharge: 2018-03-12 | Disposition: A | Discharge status: Home / Self Care | Payer: Self-pay | Source: Ambulatory Visit | Attending: Obstetrics and Gynecology | Admitting: Obstetrics and Gynecology

## 2018-03-12 ENCOUNTER — Encounter
Admission: RE | Disposition: A | Discharge status: Home / Self Care | Payer: Self-pay | Source: Ambulatory Visit | Attending: Obstetrics and Gynecology

## 2018-03-12 ENCOUNTER — Other Ambulatory Visit: Payer: Self-pay

## 2018-03-12 DIAGNOSIS — Z91030 Bee allergy status: Secondary | ICD-10-CM | POA: Insufficient documentation

## 2018-03-12 DIAGNOSIS — N926 Irregular menstruation, unspecified: Secondary | ICD-10-CM | POA: Insufficient documentation

## 2018-03-12 DIAGNOSIS — Z8041 Family history of malignant neoplasm of ovary: Secondary | ICD-10-CM | POA: Insufficient documentation

## 2018-03-12 DIAGNOSIS — F329 Major depressive disorder, single episode, unspecified: Secondary | ICD-10-CM | POA: Insufficient documentation

## 2018-03-12 DIAGNOSIS — Z8049 Family history of malignant neoplasm of other genital organs: Secondary | ICD-10-CM | POA: Insufficient documentation

## 2018-03-12 DIAGNOSIS — N840 Polyp of corpus uteri: Secondary | ICD-10-CM | POA: Insufficient documentation

## 2018-03-12 DIAGNOSIS — Z803 Family history of malignant neoplasm of breast: Secondary | ICD-10-CM | POA: Insufficient documentation

## 2018-03-12 DIAGNOSIS — Z91013 Allergy to seafood: Secondary | ICD-10-CM | POA: Insufficient documentation

## 2018-03-12 DIAGNOSIS — Z79899 Other long term (current) drug therapy: Secondary | ICD-10-CM | POA: Insufficient documentation

## 2018-03-12 DIAGNOSIS — G43909 Migraine, unspecified, not intractable, without status migrainosus: Secondary | ICD-10-CM | POA: Insufficient documentation

## 2018-03-12 DIAGNOSIS — N921 Excessive and frequent menstruation with irregular cycle: Secondary | ICD-10-CM

## 2018-03-12 DIAGNOSIS — F17210 Nicotine dependence, cigarettes, uncomplicated: Secondary | ICD-10-CM | POA: Insufficient documentation

## 2018-03-12 DIAGNOSIS — K449 Diaphragmatic hernia without obstruction or gangrene: Secondary | ICD-10-CM | POA: Insufficient documentation

## 2018-03-12 DIAGNOSIS — Z6839 Body mass index (BMI) 39.0-39.9, adult: Secondary | ICD-10-CM | POA: Insufficient documentation

## 2018-03-12 DIAGNOSIS — D649 Anemia, unspecified: Secondary | ICD-10-CM | POA: Insufficient documentation

## 2018-03-12 DIAGNOSIS — Z801 Family history of malignant neoplasm of trachea, bronchus and lung: Secondary | ICD-10-CM | POA: Insufficient documentation

## 2018-03-12 HISTORY — PX: DILATATION & CURETTAGE/HYSTEROSCOPY WITH MYOSURE: SHX6511

## 2018-03-12 LAB — POCT PREGNANCY, URINE: PREG TEST UR: NEGATIVE

## 2018-03-12 SURGERY — DILATATION & CURETTAGE/HYSTEROSCOPY WITH MYOSURE
Anesthesia: General

## 2018-03-12 MED ORDER — HYDROCODONE-ACETAMINOPHEN 5-325 MG PO TABS
1.0000 | ORAL_TABLET | Freq: Once | ORAL | Status: AC
  Administered 2018-03-12: 1 via ORAL

## 2018-03-12 MED ORDER — FENTANYL CITRATE (PF) 100 MCG/2ML IJ SOLN
INTRAMUSCULAR | Status: AC
  Administered 2018-03-12: 25 ug via INTRAVENOUS
  Filled 2018-03-12: qty 2

## 2018-03-12 MED ORDER — FENTANYL CITRATE (PF) 100 MCG/2ML IJ SOLN
INTRAMUSCULAR | Status: AC
  Filled 2018-03-12: qty 2

## 2018-03-12 MED ORDER — LIDOCAINE HCL (CARDIAC) PF 100 MG/5ML IV SOSY
PREFILLED_SYRINGE | INTRAVENOUS | Status: DC | PRN
  Administered 2018-03-12: 100 mg via INTRAVENOUS

## 2018-03-12 MED ORDER — FENTANYL CITRATE (PF) 100 MCG/2ML IJ SOLN
INTRAMUSCULAR | Status: DC | PRN
  Administered 2018-03-12: 75 ug via INTRAVENOUS
  Administered 2018-03-12: 25 ug via INTRAVENOUS
  Administered 2018-03-12 (×2): 50 ug via INTRAVENOUS

## 2018-03-12 MED ORDER — HYDROCODONE-ACETAMINOPHEN 5-325 MG PO TABS
ORAL_TABLET | ORAL | Status: AC
  Administered 2018-03-12: 1 via ORAL
  Filled 2018-03-12: qty 1

## 2018-03-12 MED ORDER — HYDROCODONE-ACETAMINOPHEN 5-325 MG PO TABS: 1.0000 | ORAL_TABLET | Freq: Three times a day (TID) | ORAL | 0 refills | Status: DC | PRN

## 2018-03-12 MED ORDER — DEXAMETHASONE SODIUM PHOSPHATE 10 MG/ML IJ SOLN
INTRAMUSCULAR | Status: DC | PRN
  Administered 2018-03-12: 10 mg via INTRAVENOUS

## 2018-03-12 MED ORDER — FENTANYL CITRATE (PF) 100 MCG/2ML IJ SOLN
25.0000 ug | INTRAMUSCULAR | Status: AC | PRN
  Administered 2018-03-12 (×6): 25 ug via INTRAVENOUS

## 2018-03-12 MED ORDER — KETOROLAC TROMETHAMINE 30 MG/ML IJ SOLN
INTRAMUSCULAR | Status: DC | PRN
  Administered 2018-03-12: 30 mg via INTRAVENOUS

## 2018-03-12 MED ORDER — MIDAZOLAM HCL 2 MG/2ML IJ SOLN
INTRAMUSCULAR | Status: AC
  Filled 2018-03-12: qty 2

## 2018-03-12 MED ORDER — LACTATED RINGERS IV SOLN
INTRAVENOUS | Status: DC
  Administered 2018-03-12: 10:00:00 via INTRAVENOUS

## 2018-03-12 MED ORDER — SUGAMMADEX SODIUM 200 MG/2ML IV SOLN
INTRAVENOUS | Status: DC | PRN
  Administered 2018-03-12: 222 mg via INTRAVENOUS

## 2018-03-12 MED ORDER — IBUPROFEN 800 MG PO TABS: 800.0000 mg | ORAL_TABLET | Freq: Three times a day (TID) | ORAL | 0 refills | Status: DC | PRN

## 2018-03-12 MED ORDER — MIDAZOLAM HCL 2 MG/2ML IJ SOLN
INTRAMUSCULAR | Status: DC | PRN
  Administered 2018-03-12: 2 mg via INTRAVENOUS

## 2018-03-12 MED ORDER — HYDROMORPHONE HCL 1 MG/ML IJ SOLN
INTRAMUSCULAR | Status: AC
  Administered 2018-03-12: 0.5 mg via INTRAVENOUS
  Filled 2018-03-12: qty 1

## 2018-03-12 MED ORDER — HYDROMORPHONE HCL 1 MG/ML IJ SOLN
0.5000 mg | INTRAMUSCULAR | Status: DC | PRN
  Administered 2018-03-12 (×3): 0.5 mg via INTRAVENOUS

## 2018-03-12 MED ORDER — SILVER NITRATE-POT NITRATE 75-25 % EX MISC
CUTANEOUS | Status: AC
  Filled 2018-03-12: qty 4

## 2018-03-12 MED ORDER — PROPOFOL 10 MG/ML IV BOLUS
INTRAVENOUS | Status: AC
  Filled 2018-03-12: qty 20

## 2018-03-12 MED ORDER — ROCURONIUM BROMIDE 100 MG/10ML IV SOLN
INTRAVENOUS | Status: DC | PRN
  Administered 2018-03-12: 50 mg via INTRAVENOUS

## 2018-03-12 MED ORDER — ONDANSETRON HCL 4 MG/2ML IJ SOLN
INTRAMUSCULAR | Status: DC | PRN
  Administered 2018-03-12: 4 mg via INTRAVENOUS

## 2018-03-12 MED ORDER — PROPOFOL 10 MG/ML IV BOLUS
INTRAVENOUS | Status: DC | PRN
  Administered 2018-03-12: 200 mg via INTRAVENOUS

## 2018-03-12 MED ORDER — LACTATED RINGERS IV SOLN: INTRAVENOUS | Status: DC

## 2018-03-12 SURGICAL SUPPLY — 24 items
BAG URINE DRAINAGE (UROLOGICAL SUPPLIES) IMPLANT
CANISTER SUC SOCK COL 7IN (MISCELLANEOUS) IMPLANT
CATH FOLEY 2WAY  5CC 16FR (CATHETERS)
CATH ROBINSON RED A/P 16FR (CATHETERS) ×3 IMPLANT
CATH URTH 16FR FL 2W BLN LF (CATHETERS) IMPLANT
COVER WAND RF STERILE (DRAPES) IMPLANT
DEVICE MYOSURE LITE (MISCELLANEOUS) IMPLANT
DEVICE MYOSURE REACH (MISCELLANEOUS) IMPLANT
ELECT REM PT RETURN 9FT ADLT (ELECTROSURGICAL) ×3
ELECTRODE REM PT RTRN 9FT ADLT (ELECTROSURGICAL) ×1 IMPLANT
GLOVE BIO SURGEON STRL SZ7 (GLOVE) ×9 IMPLANT
GLOVE BIOGEL PI IND STRL 7.5 (GLOVE) ×3 IMPLANT
GLOVE BIOGEL PI INDICATOR 7.5 (GLOVE) ×6
GOWN STRL REUS W/ TWL LRG LVL3 (GOWN DISPOSABLE) ×2 IMPLANT
GOWN STRL REUS W/TWL LRG LVL3 (GOWN DISPOSABLE) ×4
IV LACTATED RINGER IRRG 3000ML (IV SOLUTION) ×2
IV LR IRRIG 3000ML ARTHROMATIC (IV SOLUTION) ×1 IMPLANT
KIT TURNOVER CYSTO (KITS) ×3 IMPLANT
PACK DNC HYST (MISCELLANEOUS) ×3 IMPLANT
PAD OB MATERNITY 4.3X12.25 (PERSONAL CARE ITEMS) ×3 IMPLANT
PAD PREP 24X41 OB/GYN DISP (PERSONAL CARE ITEMS) ×3 IMPLANT
TUBING CONNECTING 10 (TUBING) ×2 IMPLANT
TUBING CONNECTING 10' (TUBING) ×1
TUBING HYSTEROSCOPY DOLPHIN (MISCELLANEOUS) IMPLANT

## 2018-03-12 NOTE — Interval H&P Note (Signed)
History and Physical Interval Note:  03/12/2018 9:06 AM  Valerie Massey  has presented today for surgery, with the diagnosis of ENDOMETRIAL POLYP,MENORRHAGIA WITH IRREGULAR MENSES  The various methods of treatment have been discussed with the patient and family. After consideration of risks, benefits and other options for treatment, the patient has consented to  Procedure(s): [place] (N/A), as well as endometrial polypectomy as a surgical intervention .  The patient's history has been reviewed, patient examined, no change in status, stable for surgery.  I have reviewed the patient's chart and labs.  Questions were answered to the patient's satisfaction.    Prentice Docker, MD, [facility], [facility] 03/12/2018 9:06 AM  i

## 2018-03-12 NOTE — Anesthesia Preprocedure Evaluation (Signed)
Anesthesia Evaluation  Patient identified by MRN, date of birth, ID band Patient awake    Reviewed: Allergy & Precautions, H&P , NPO status , Patient's Chart, lab work & pertinent test results, reviewed documented beta blocker date and time   Airway Mallampati: II  TM Distance: >3 FB Neck ROM: full    Dental  (+) Teeth Intact   Pulmonary neg pulmonary ROS, Current Smoker,    Pulmonary exam normal        Cardiovascular Exercise Tolerance: Good negative cardio ROS Normal cardiovascular exam Rate:Normal     Neuro/Psych  Headaches, PSYCHIATRIC DISORDERS Depression negative neurological ROS  negative psych ROS   GI/Hepatic negative GI ROS, Neg liver ROS, hiatal hernia,   Endo/Other  Morbid obesity  Renal/GU negative Renal ROS  negative genitourinary   Musculoskeletal   Abdominal   Peds  Hematology negative hematology ROS (+) Blood dyscrasia, anemia ,   Anesthesia Other Findings   Reproductive/Obstetrics negative OB ROS                             Anesthesia Physical Anesthesia Plan  ASA: III  Anesthesia Plan: General LMA   Post-op Pain Management:    Induction:   PONV Risk Score and Plan:   Airway Management Planned:   Additional Equipment:   Intra-op Plan:   Post-operative Plan:   Informed Consent: I have reviewed the patients History and Physical, chart, labs and discussed the procedure including the risks, benefits and alternatives for the proposed anesthesia with the patient or authorized representative who has indicated his/her understanding and acceptance.     Plan Discussed with: CRNA  Anesthesia Plan Comments:         Anesthesia Quick Evaluation

## 2018-03-12 NOTE — Anesthesia Procedure Notes (Signed)
Procedure Name: Intubation Date/Time: 03/12/2018 10:12 AM Performed by: Philbert Riser, CRNA Pre-anesthesia Checklist: Patient identified, Emergency Drugs available, Suction available, Patient being monitored and Timeout performed Patient Re-evaluated:Patient Re-evaluated prior to induction Oxygen Delivery Method: Circle system utilized and Simple face mask Preoxygenation: Pre-oxygenation with 100% oxygen Induction Type: IV induction Ventilation: Mask ventilation without difficulty Laryngoscope Size: Mac and 3 Grade View: Grade I Tube type: Oral Tube size: 7.0 mm Number of attempts: 1 Airway Equipment and Method: Stylet Placement Confirmation: ETT inserted through vocal cords under direct vision,  positive ETCO2 and breath sounds checked- equal and bilateral Secured at: 22 cm Tube secured with: Tape Dental Injury: Teeth and Oropharynx as per pre-operative assessment

## 2018-03-12 NOTE — Transfer of Care (Signed)
Immediate Anesthesia Transfer of Care Note  Patient: Valerie Massey  Procedure(s) Performed: DILATATION & CURETTAGE/HYSTEROSCOPY WITH MYOSURE (N/A )  Patient Location: PACU  Anesthesia Type:General  Level of Consciousness: drowsy  Airway & Oxygen Therapy: Patient Spontanous Breathing and Patient connected to face mask oxygen  Post-op Assessment: Report given to RN and Post -op Vital signs reviewed and stable  Post vital signs: Reviewed and stable  Last Vitals:  Vitals Value Taken Time  BP    Temp    Pulse    Resp    SpO2      Last Pain:  Vitals:   03/12/18 0839  TempSrc: Temporal  PainSc: 0-No pain         Complications: No apparent anesthesia complications

## 2018-03-12 NOTE — Anesthesia Post-op Follow-up Note (Signed)
Anesthesia QCDR form completed.        

## 2018-03-12 NOTE — Op Note (Signed)
Operative Note   03/12/2018  PRE-OP DIAGNOSIS:  1) menorrhagia with irregular cycle 2) suspected endometrial polyp   POST-OP DIAGNOSIS:  1) menorrhagia with irregular cycle 2) suspected endometrial polyp - no evidence of discrete polyp   SURGEON: Surgeon(s) and Role:    Will Bonnet, MD - Primary  PROCEDURE: Procedure(s): 1) Hysteroscopy 2) dilation and curettage  ANESTHESIA: General  ESTIMATED BLOOD LOSS: 10 mL  DRAINS: none   TOTAL IV FLUIDS: 700 mL crystalloid  SPECIMENS:  1) endometrial lining (obtained using MyoSure resection) 2) endometrial curettings  VTE PROPHYLAXIS: SCDs to the bilateral lower extremities  ANTIBIOTICS: none indicated, none given.  FLUID DEFICIT: 191 mL  COMPLICATIONS: none  DISPOSITION: PACU - hemodynamically stable.  CONDITION: stable  INDICATION: 37 y.o. G0P0000 female with history of abnormal uterine bleeding with a previous endometrial biopsy in 2016 showing fragments of a benign endometrial polyp.  Repeat biopsy in 2019 showed fragments of benign endometrial polyp and disordered proliferative phase endometrium.    FINDINGS: Exam under anesthesia revealed small, mobile anteverted uterus with no masses and bilateral adnexa without masses or fullness, though body habitus greatly obscured physical exam. Hysteroscopy revealed a globally shaggy endometrium with an otherwise grossly normal appearing uterine cavity with bilateral tubal ostia and normal appearing endocervical canal.  No definitive polyp was identified.   PROCEDURE IN DETAIL:  After informed consent was obtained, the patient was taken to the operating room where anesthesia was obtained without difficulty. The patient was positioned in the dorsal lithotomy position in candy cane stirrups.  The patient's bladder was catheterized with an in-and-out foley catheter.  The patient was examined under anesthesia, with the above noted findings.  The bi-valved speculum was placed inside  the patient's vagina, and the the anterior lip of the cervix was seen and grasped with the tenaculum.  The cervix was progressively dilated to a 7 mm Hegar dilator.  The hysteroscope was introduced, with the above noted findings.  The MyoSure lite device was introduced through the operative port of the hysteroscope and the endometrium was sampled globally until an essentially smooth surface was obtained.  The hysteroscope and Myosure device were removed and the uterus was sampled using a curettage globally.  The hysteroscope was reintroduced to verify adequate sampling and removal of as much shaggy endometrial tissue as possible.  The pressure of the inflow fluid was gradually reduced to well below that of the MAP.  Once hemostasis was verified the hysteroscope was removed.  The tenaculum was removed and hemostasis was achieved using silver nitrate at the tenaculum entry sites.  Excellent hemostasis was noted, and all instruments were removed, with excellent hemostasis noted throughout.  She was then taken out of dorsal lithotomy.  The patient tolerated the procedure well.  Sponge, lap and needle counts were correct x2.  The patient was taken to recovery room in excellent condition.  Will Bonnet, MD, [facility] 03/12/2018 11:18 AM

## 2018-03-12 NOTE — Anesthesia Postprocedure Evaluation (Signed)
Anesthesia Post Note  Patient: Valerie Massey  Procedure(s) Performed: DILATATION & CURETTAGE/HYSTEROSCOPY WITH MYOSURE (N/A )  Patient location during evaluation: PACU Anesthesia Type: General Level of consciousness: awake and alert Pain management: pain level controlled Vital Signs Assessment: post-procedure vital signs reviewed and stable Respiratory status: spontaneous breathing, nonlabored ventilation, respiratory function stable and patient connected to nasal cannula oxygen Cardiovascular status: blood pressure returned to baseline and stable Postop Assessment: no apparent nausea or vomiting Anesthetic complications: no     Last Vitals:  Vitals:   03/12/18 1323 03/12/18 1342  BP: 132/67 138/72  Pulse: 72 70  Resp: 14   Temp: (!) 35.6 C   SpO2: 97% 97%    Last Pain:  Vitals:   03/12/18 1342  TempSrc:   PainSc: Phillips Zlata Alcaide

## 2018-03-12 NOTE — Progress Notes (Signed)
States wants to go home 

## 2018-03-12 NOTE — Discharge Instructions (Signed)

## 2018-03-13 LAB — SURGICAL PATHOLOGY

## 2018-03-16 ENCOUNTER — Telehealth: Payer: Self-pay

## 2018-03-16 NOTE — Telephone Encounter (Signed)
Called patient back. She is still cramping. She is cramping daily (at some point and it doesn't get that bad).  She denies fevers and chills.  Her bleeding is like a normal period.  She is having normal bowel and bladder function and is tolerating a regular diet. She just wanted to make sure what she was experiencing was normal.   Reassured patient. She is to call for worsening of symptoms or any new symptom of concern. She voiced understanding and agreement.

## 2018-03-16 NOTE — Telephone Encounter (Signed)
Pt called triage saying she had a hysteroscopy and D&C on Thursday, she is cramping significantly. Is it normal? How long more will this last? Also bleeding, wants to know how long she can expect these symptoms. CB# [phone]

## 2018-03-17 ENCOUNTER — Emergency Department: Payer: Self-pay

## 2018-03-17 ENCOUNTER — Other Ambulatory Visit: Payer: Self-pay

## 2018-03-17 ENCOUNTER — Emergency Department
Admission: EM | Admit: 2018-03-17 | Discharge: 2018-03-17 | Disposition: A | Discharge status: Home / Self Care | Payer: Self-pay | Attending: Emergency Medicine | Admitting: Emergency Medicine

## 2018-03-17 ENCOUNTER — Telehealth: Payer: Self-pay

## 2018-03-17 DIAGNOSIS — Z79899 Other long term (current) drug therapy: Secondary | ICD-10-CM | POA: Insufficient documentation

## 2018-03-17 DIAGNOSIS — N3001 Acute cystitis with hematuria: Secondary | ICD-10-CM | POA: Insufficient documentation

## 2018-03-17 DIAGNOSIS — F17210 Nicotine dependence, cigarettes, uncomplicated: Secondary | ICD-10-CM | POA: Insufficient documentation

## 2018-03-17 DIAGNOSIS — R109 Unspecified abdominal pain: Secondary | ICD-10-CM | POA: Insufficient documentation

## 2018-03-17 DIAGNOSIS — G8918 Other acute postprocedural pain: Secondary | ICD-10-CM | POA: Insufficient documentation

## 2018-03-17 DIAGNOSIS — N939 Abnormal uterine and vaginal bleeding, unspecified: Secondary | ICD-10-CM

## 2018-03-17 DIAGNOSIS — N938 Other specified abnormal uterine and vaginal bleeding: Secondary | ICD-10-CM | POA: Insufficient documentation

## 2018-03-17 LAB — COMPREHENSIVE METABOLIC PANEL
ALBUMIN: 4.2 g/dL (ref 3.5–5.0)
ALK PHOS: 67 U/L (ref 38–126)
ALT: 24 U/L (ref 0–44)
ANION GAP: 11 (ref 5–15)
AST: 20 U/L (ref 15–41)
BILIRUBIN TOTAL: 0.5 mg/dL (ref 0.3–1.2)
BUN: 9 mg/dL (ref 6–20)
CALCIUM: 9.4 mg/dL (ref 8.9–10.3)
CO2: 25 mmol/L (ref 22–32)
CREATININE: 0.84 mg/dL (ref 0.44–1.00)
Chloride: 103 mmol/L (ref 98–111)
GFR calc Af Amer: >60 mL/min (ref >60)
GFR calc non Af Amer: >60 mL/min (ref >60)
GLUCOSE: 148 mg/dL — AB (ref 70–99)
Potassium: 4 mmol/L (ref 3.5–5.1)
Sodium: 139 mmol/L (ref 135–145)
TOTAL PROTEIN: 8.4 g/dL — AB (ref 6.5–8.1)

## 2018-03-17 LAB — URINALYSIS, COMPLETE (UACMP) WITH MICROSCOPIC
BILIRUBIN URINE: NEGATIVE
GLUCOSE, UA: NEGATIVE mg/dL
Ketones, ur: NEGATIVE mg/dL
NITRITE: NEGATIVE
PROTEIN: 30 mg/dL — AB
SPECIFIC GRAVITY, URINE: 1.006 (ref 1.005–1.030)
pH: 7 (ref 5.0–8.0)

## 2018-03-17 LAB — CBC
HCT: 42.6 % (ref 36.0–46.0)
Hemoglobin: 13.9 g/dL (ref 12.0–15.0)
MCH: 27.5 pg (ref 26.0–34.0)
MCHC: 32.6 g/dL (ref 30.0–36.0)
MCV: 84.4 fL (ref 80.0–100.0)
PLATELETS: 365 10*3/uL (ref 150–400)
RBC: 5.05 MIL/uL (ref 3.87–5.11)
RDW: 14.2 % (ref 11.5–15.5)
WBC: 11.2 10*3/uL — ABNORMAL HIGH (ref 4.0–10.5)
nRBC: 0 % (ref 0.0–0.2)

## 2018-03-17 LAB — LACTIC ACID, PLASMA
LACTIC ACID, VENOUS: 2.4 mmol/L — AB (ref 0.5–1.9)
Lactic Acid, Venous: 1.3 mmol/L (ref 0.5–1.9)

## 2018-03-17 LAB — LIPASE, BLOOD: Lipase: 28 U/L (ref 11–51)

## 2018-03-17 LAB — POCT PREGNANCY, URINE: Preg Test, Ur: NEGATIVE

## 2018-03-17 MED ORDER — MORPHINE SULFATE (PF) 4 MG/ML IV SOLN
4.0000 mg | Freq: Once | INTRAVENOUS | Status: AC
  Administered 2018-03-17: 4 mg via INTRAVENOUS
  Filled 2018-03-17: qty 1

## 2018-03-17 MED ORDER — SODIUM CHLORIDE 0.9 % IV SOLN
1.0000 g | Freq: Once | INTRAVENOUS | Status: AC
  Administered 2018-03-17: 1 g via INTRAVENOUS
  Filled 2018-03-17: qty 10

## 2018-03-17 MED ORDER — MEDROXYPROGESTERONE ACETATE 10 MG PO TABS
40.0000 mg | ORAL_TABLET | Freq: Every day | ORAL | Status: DC
  Administered 2018-03-17: 40 mg via ORAL
  Filled 2018-03-17: qty 4

## 2018-03-17 MED ORDER — SODIUM CHLORIDE 0.9 % IV BOLUS
1000.0000 mL | Freq: Once | INTRAVENOUS | Status: AC
  Administered 2018-03-17: 1000 mL via INTRAVENOUS

## 2018-03-17 MED ORDER — ONDANSETRON HCL 4 MG/2ML IJ SOLN
4.0000 mg | Freq: Once | INTRAMUSCULAR | Status: AC
  Administered 2018-03-17: 4 mg via INTRAVENOUS
  Filled 2018-03-17: qty 2

## 2018-03-17 MED ORDER — KETOROLAC TROMETHAMINE 30 MG/ML IJ SOLN
15.0000 mg | Freq: Once | INTRAMUSCULAR | Status: AC
  Administered 2018-03-17: 15 mg via INTRAVENOUS
  Filled 2018-03-17: qty 1

## 2018-03-17 MED ORDER — CEPHALEXIN 500 MG PO CAPS: 500.0000 mg | ORAL_CAPSULE | Freq: Three times a day (TID) | ORAL | 0 refills | Status: DC

## 2018-03-17 MED ORDER — MEDROXYPROGESTERONE ACETATE 10 MG PO TABS: 40.0000 mg | ORAL_TABLET | Freq: Every day | ORAL | 0 refills | Status: DC

## 2018-03-17 MED ORDER — IOPAMIDOL (ISOVUE-300) INJECTION 61%
100.0000 mL | Freq: Once | INTRAVENOUS | Status: AC | PRN
  Administered 2018-03-17: 100 mL via INTRAVENOUS

## 2018-03-17 NOTE — Telephone Encounter (Signed)
Left message to see if she could come in this afternoon?

## 2018-03-17 NOTE — Discharge Instructions (Signed)

## 2018-03-17 NOTE — ED Provider Notes (Signed)
[facility] Emergency Department Provider Note  ____________________________________________  Time seen: Approximately 2:22 PM  I have reviewed the triage vital signs and the nursing notes.   HISTORY  Chief Complaint Post-op Problem   HPI Valerie Massey is a 37 y.o. female postop day 5 from D&C from abnormal uterine bleeding who presents for evaluation of abdominal pain and near syncopal episode.  Patient reports that she has been out of work since her procedure.  She try to go back to work today.  She was doing a lot of heavy work and cleaning.  She reports that she was unable to take her Vicodin because that makes her sleepy.  The pain got progressively worse throughout the day.  She reports that she started feeling clammy and dizzy like she was going to pass out.  She reports that the vaginal bleeding is getting progressively worse.  She continues to pass large clots.  She is going through several pads a day.  She is complaining of 6 out of 10 lower abdominal cramping that has been constant since the surgery and worse today, radiating to her back.  No fever or chills.  She has had nausea today but no vomiting.  She denies dysuria but does endorse pressure when she urinates.   Past Medical History:  Diagnosis Date  . Anemia   . Depression   . History of hiatal hernia   . Migraines     Patient Active Problem List   Diagnosis Date Noted  . Endometrial polyp 02/10/2018  . Menorrhagia with irregular cycle 01/28/2018    Past Surgical History:  Procedure Laterality Date  . DILATATION & CURETTAGE/HYSTEROSCOPY WITH MYOSURE N/A 03/12/2018   Procedure: DILATATION & CURETTAGE/HYSTEROSCOPY WITH MYOSURE;  Surgeon: Will Bonnet, MD;  Location: [facility] ORS;  Service: Gynecology;  Laterality: N/A;  . WISDOM TOOTH EXTRACTION      Prior to Admission medications   Medication Sig Start Date End Date Taking? Authorizing Provider  acetaminophen (TYLENOL) 500 MG  tablet Take 500-1,000 mg by mouth every 6 (six) hours as needed for mild pain or moderate pain.   Yes [provider]  calcium carbonate (TUMS - DOSED IN MG ELEMENTAL CALCIUM) 500 MG chewable tablet Chew 2 tablets by mouth daily as needed for indigestion or heartburn.   Yes [provider]  HYDROcodone-acetaminophen (NORCO/VICODIN) 5-325 MG tablet Take 1 tablet by mouth every 8 (eight) hours as needed (breakthrough pain). 03/12/18  Yes Will Bonnet, MD  ibuprofen (ADVIL,MOTRIN) 800 MG tablet Take 1 tablet (800 mg total) by mouth every 8 (eight) hours as needed. 03/12/18  Yes Will Bonnet, MD  Multiple Vitamin (MULTIVITAMIN WITH MINERALS) TABS tablet Take 1 tablet by mouth once a week.   Yes [provider]  omeprazole (PRILOSEC) 40 MG capsule Take 40 mg by mouth daily.    Yes [provider]  cephALEXin (KEFLEX) 500 MG capsule Take 1 capsule (500 mg total) by mouth 3 (three) times daily for 7 days. 03/17/18 03/24/18  Rudene Re, MD  medroxyPROGESTERone (PROVERA) 10 MG tablet Take 4 tablets (40 mg total) by mouth daily for 4 days. 03/17/18 03/21/18  Rudene Re, MD    Allergies Bee venom and Other  Family History  Problem Relation Age of Onset  . Breast cancer Mother 73  . Ovarian cancer Mother 43  . Endometrial cancer Maternal Grandmother 19  . Lung cancer Maternal Grandfather 60  . Lung cancer Paternal Grandfather 28  Social History Social History   Tobacco Use  . Smoking status: Current Every Day Smoker    Packs/day: 0.50    Types: Cigarettes    Start date: 05/06/1997  . Smokeless tobacco: Never Used  Substance Use Topics  . Alcohol use: Yes    Comment: 1 q month  . Drug use: Never    Review of Systems  Constitutional: Negative for fever. + near syncope Eyes: Negative for visual changes. ENT: Negative for sore throat. Neck: No neck pain  Cardiovascular: Negative for chest pain. Respiratory: Negative for  shortness of breath. Gastrointestinal: + abdominal pain and nausea. No vomiting or diarrhea. Genitourinary: Negative for dysuria. + vaginal bleeding Musculoskeletal: Negative for back pain. Skin: Negative for rash. Neurological: Negative for headaches, weakness or numbness. Psych: No SI or HI  ____________________________________________   PHYSICAL EXAM:  VITAL SIGNS: ED Triage Vitals  Enc Vitals Group     BP 03/17/18 1311 137/90     Pulse Rate 03/17/18 1311 (!) 128     Resp 03/17/18 1311 18     Temp 03/17/18 1311 97.9 F (36.6 C)     Temp Source 03/17/18 1311 Oral     SpO2 03/17/18 1311 98 %     Weight --      Height --      Head Circumference --      Peak Flow --      Pain Score 03/17/18 1322 6     Pain Loc --      Pain Edu? --      Excl. in [place]? --     Constitutional: Alert and oriented. Well appearing and in no apparent distress. HEENT:      Head: Normocephalic and atraumatic.         Eyes: Conjunctivae are normal. Sclera is non-icteric.       Mouth/Throat: Mucous membranes are moist.       Neck: Supple with no signs of meningismus. Cardiovascular: Tachycardic with regular rhythm. No murmurs, gallops, or rubs. 2+ symmetrical distal pulses are present in all extremities. No JVD. Respiratory: Normal respiratory effort. Lungs are clear to auscultation bilaterally. No wheezes, crackles, or rhonchi.  Gastrointestinal: Soft, diffusely tender to palpation worse on the lower quadrants, non distended with positive bowel sounds. No rebound or guarding. Genitourinary: No CVA tenderness. Musculoskeletal: Nontender with normal range of motion in all extremities. No edema, cyanosis, or erythema of extremities. Neurologic: Normal speech and language. Face is symmetric. Moving all extremities. No gross focal neurologic deficits are appreciated. Skin: Skin is warm, dry and intact. No rash noted. Psychiatric: Mood and affect are normal. Speech and behavior are  normal.  ____________________________________________   LABS (all labs ordered are listed, but only abnormal results are displayed)  Labs Reviewed  COMPREHENSIVE METABOLIC PANEL - Abnormal; Notable for the following components:      Result Value   Glucose, Bld 148 (*)    Total Protein 8.4 (*)    All other components within normal limits  CBC - Abnormal; Notable for the following components:   WBC 11.2 (*)    All other components within normal limits  URINALYSIS, COMPLETE (UACMP) WITH MICROSCOPIC - Abnormal; Notable for the following components:   Color, Urine YELLOW (*)    APPearance CLEAR (*)    Hgb urine dipstick LARGE (*)    Protein, ur 30 (*)    Leukocytes, UA SMALL (*)    RBC / HPF >50 (*)    Bacteria, UA RARE (*)  All other components within normal limits  LACTIC ACID, PLASMA - Abnormal; Notable for the following components:   Lactic Acid, Venous 2.4 (*)    All other components within normal limits  LIPASE, BLOOD  LACTIC ACID, PLASMA  POC URINE PREG, ED  POCT PREGNANCY, URINE   ____________________________________________  EKG  none  ____________________________________________  RADIOLOGY  I have personally reviewed the images performed during this visit and I agree with the Radiologist's read.   Interpretation by Radiologist:  Ct Abdomen Pelvis W Contrast  Result Date: 03/17/2018 CLINICAL DATA:  D&C this past Thursday now with vaginal bleeding, abdominal pain and chills. EXAM: CT ABDOMEN AND PELVIS WITH CONTRAST TECHNIQUE: Multidetector CT imaging of the abdomen and pelvis was performed using the standard protocol following bolus administration of intravenous contrast. CONTRAST:  131mL ISOVUE-300 IOPAMIDOL (ISOVUE-300) INJECTION 61% COMPARISON:  CT abdomen pelvis-12/20/2013 FINDINGS: Lower chest: Limited visualization of the lower thorax is negative for focal airspace opacity or pleural effusion. Normal heart size.  No pericardial effusion. Hepatobiliary:  Normal hepatic contour. There is diffuse decreased attenuation of the hepatic parenchyma on this postcontrast examination suggestive of hepatic steatosis. No discrete hepatic lesions. Normal appearance of the gallbladder given degree distention. No radiopaque gallstones. No intra or extrahepatic biliary ductal dilatation. No ascites. Pancreas: Normal appearance of the pancreas Spleen: Normal appearance of the spleen Adrenals/Urinary Tract: There is symmetric enhancement and excretion of the bilateral kidneys. No definite renal stones on this postcontrast examination. No discrete renal lesions. No urine obstruction or perinephric stranding. Normal appearance the bilateral adrenal glands. There is mild diffuse thickening of the urinary bladder wall with minimal amount of adjacent stranding, most conspicuous about the left side of the urinary bladder (image 81, series 2; coronal image 44, series 5). Stomach/Bowel: Moderate colonic stool burden without evidence of enteric obstruction. Normal appearance of the terminal ileum and retrocecal appendix. Radiopaque pill fragments are seen within the cecum. The bowel is normal in course and caliber without discrete area of wall thickening. No pneumoperitoneum, pneumatosis or portal venous gas. Vascular/Lymphatic: Normal caliber of the abdominal aorta. The major branch vessels of the abdominal aorta appear patent on this non CTA examination. 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 No bulky retroperitoneal, mesenteric, pelvic or inguinal lymphadenopathy. Reproductive: Note is made of an approximately 2.5 x 1.5 cm hypoattenuating right-sided presumably physiologic adnexal cyst. No discrete left-sided adnexal lesion. Normal appearance of the uterus. There is a trace amount of fluid within the pelvic cul-de-sac, presumably physiologic. Other: There is a minimal amount of subcutaneous edema about the midline of the low back. Musculoskeletal: No acute or aggressive  osseous abnormalities. IMPRESSION: 1. Apparent thickening of the urinary bladder wall with associated minimal amount of stranding, nonspecific though could be seen in the setting of a cystitis. Correlation with urinalysis is advised. No evidence of urinary obstruction or CT evidence of pyelonephritis. 2. Otherwise, no explanation for patient's abdominal pain and chills. Specifically, no definitive evidence of complication associated with recent D & C procedure. 3. Suspected hepatic steatosis.  Correlation with LFTs is advised. Electronically Signed   By: Sandi Mariscal M.D.   On: 03/17/2018 15:38      ____________________________________________   PROCEDURES  Procedure(s) performed: None Procedures Critical Care performed:  None ____________________________________________   INITIAL IMPRESSION / ASSESSMENT AND PLAN / ED COURSE   37 y.o. female postop day 5 from D&C from abnormal uterine bleeding who presents for evaluation of worsening abdominal pain, continued vaginal bleeding, and near syncopal  episode.  Patient is tachycardic but normotensive, diffusely tender on her abdomen with positive bowel sounds, there is no rebound or guarding.  Hemoglobin is stable at 13.9.  Mild leukocytosis with white count of 11.2 also mild lactic acidosis of 2.4.  UA showing blood and rare bacteria but no nitrites.  We will get a CT of abdomen pelvis to rule out any postop complications including intra-abdominal infection versus perforation.  Near syncopal event most likely due to pain/vasovagal/vaginal bleeding.  there is no signs of acute blood loss anemia at this time.  Will give IV fluids.  Will give morphine and Zofran for symptom relief.    _________________________ 4:32 PM on 03/17/2018 -----------------------------------------  CT showing no complications from the surgery.  Does show some inflammation surrounding the bladder that together with the results of urinalysis are consistent for possible mild  cystitis.  Patient was given a dose of Rocephin and will be sent home on Keflex.  There are no signs of sepsis at this time.  Discussed with Dr. Kenton Kingfisher from OB/GYN about the persistent vaginal bleeding and he recommended Provera 40 mg daily for 5 days and follow-up in the clinic tomorrow morning.  Discussed these recommendations with patient who is comfortable with the plan.  Discussed my standard return precautions.  Patient is now stable for discharge home.   As part of my medical decision making, I reviewed the following data within the [place] [facility] notes reviewed and incorporated, Labs reviewed , Old chart reviewed, Radiograph reviewed , A consult was requested and obtained from this/these consultant(s) ObGYN, Notes from prior ED visits and [place] Controlled Substance Database    Pertinent labs & imaging results that were available during my care of the patient were reviewed by me and considered in my medical decision making (see chart for details).    ____________________________________________   FINAL CLINICAL IMPRESSION(S) / ED DIAGNOSES  Final diagnoses:  Post-operative pain  Acute cystitis with hematuria  Vaginal bleeding      NEW MEDICATIONS STARTED DURING THIS VISIT:  ED Discharge Orders         Ordered    cephALEXin (KEFLEX) 500 MG capsule  3 times daily     03/17/18 1634    medroxyPROGESTERone (PROVERA) 10 MG tablet  Daily     03/17/18 1634           Note:  This document was prepared using Dragon voice recognition software and may include unintentional dictation errors.    Rudene Re, MD 03/17/18 [phone]

## 2018-03-17 NOTE — ED Triage Notes (Signed)
Also says she has chills as well today

## 2018-03-17 NOTE — Telephone Encounter (Signed)
Pt calling triage stating today she feels clammy and has chills does, she need to be seen ?

## 2018-03-17 NOTE — ED Triage Notes (Signed)
States D&C on Thursday. States increase in abd pain and chills today while at work. States vaginal bleeding. Passing few clots.   A&O, ambulatory.

## 2018-03-17 NOTE — ED Triage Notes (Signed)
D & c Thursday by dr Glennon Mac.  Today tried to go back to work and got clammy.  Does not feel lightheaded now.  Low abd cramping pain.

## 2018-03-17 NOTE — Telephone Encounter (Signed)
Can you call her and see if she can come in this PM sometime?

## 2018-03-18 ENCOUNTER — Ambulatory Visit (INDEPENDENT_AMBULATORY_CARE_PROVIDER_SITE_OTHER): Payer: Self-pay | Admitting: Obstetrics and Gynecology

## 2018-03-18 ENCOUNTER — Encounter: Payer: Self-pay | Admitting: Obstetrics and Gynecology

## 2018-03-18 VITALS — BP 130/90 | Ht 66.0 in | Wt 245.0 lb

## 2018-03-18 DIAGNOSIS — R300 Dysuria: Secondary | ICD-10-CM

## 2018-03-18 MED ORDER — NITROFURANTOIN MONOHYD MACRO 100 MG PO CAPS: 100.0000 mg | ORAL_CAPSULE | Freq: Two times a day (BID) | ORAL | 0 refills | Status: AC

## 2018-03-18 NOTE — Progress Notes (Signed)
Obstetrics & Gynecology Office Visit   Chief Complaint:  Chief Complaint  Patient presents with  . Follow-up  . Urinary Tract Infection    History of Present Illness: 37 year old status post hysteroscopy D&C for endometrial polyps secondary to menorrhagia on 03/17/2018.  She states that she was starting to feel better but then had worsening in symptoms prompting presentation to the ER yesterday.  Evaluation there revealed an elevated lactate level at 2.4 (trended down to 1.3 over 2hrs), normal CMP, CBC with WBC of 11.2K.   Urine rare bacteria, small leuks, large blood.  CT imaging revealed no evidence of uterine perforation or changes.  Some bladder wall thickening.    Has been out of work because of pain.  Bleeding has also increase since the procedure with passage of large blood clots.  She was restart on provera 40mg  daily.  Pain is improved since yesterday.     Review of Systems: Review of Systems  Constitutional: Positive for chills. Negative for diaphoresis, fever and weight loss.  Gastrointestinal: Positive for abdominal pain. Negative for nausea.  Genitourinary: Positive for dysuria, frequency and urgency. Negative for flank pain and hematuria.     Past Medical History:  Past Medical History:  Diagnosis Date  . Anemia   . Depression   . History of hiatal hernia   . Migraines     Past Surgical History:  Past Surgical History:  Procedure Laterality Date  . DILATATION & CURETTAGE/HYSTEROSCOPY WITH MYOSURE N/A 03/12/2018   Procedure: DILATATION & CURETTAGE/HYSTEROSCOPY WITH MYOSURE;  Surgeon: Will Bonnet, MD;  Location: [facility] ORS;  Service: Gynecology;  Laterality: N/A;  . WISDOM TOOTH EXTRACTION      Gynecologic History: No LMP recorded (lmp unknown).  Obstetric History: G0P0000  Family History:  Family History  Problem Relation Age of Onset  . Breast cancer Mother 17  . Ovarian cancer Mother 8  . Endometrial cancer Maternal Grandmother 55  . Lung  cancer Maternal Grandfather 60  . Lung cancer Paternal Grandfather 68    Social History:  Social History   Socioeconomic History  . Marital status: Single    Spouse name: Not on file  . Number of children: Not on file  . Years of education: Not on file  . Highest education level: Not on file  Occupational History  . Occupation: Investment banker, operational  . Financial resource strain: Not on file  . Food insecurity:    Worry: Not on file    Inability: Not on file  . Transportation needs:    Medical: Not on file    Non-medical: Not on file  Tobacco Use  . Smoking status: Current Every Day Smoker    Packs/day: 0.50    Types: Cigarettes    Start date: 05/06/1997  . Smokeless tobacco: Never Used  Substance and Sexual Activity  . Alcohol use: Yes    Comment: 1 q month  . Drug use: Never  . Sexual activity: Yes    Partners: Male    Birth control/protection: None  Lifestyle  . Physical activity:    Days per week: 0 days    Minutes per session: Not on file  . Stress: Not on file  Relationships  . Social connections:    Talks on phone: Not on file    Gets together: Not on file    Attends religious service: Not on file    Active member of club or organization: Not on file  Attends meetings of clubs or organizations: Not on file    Relationship status: Not on file  . Intimate partner violence:    Fear of current or ex partner: Not on file    Emotionally abused: Not on file    Physically abused: Not on file    Forced sexual activity: Not on file  Other Topics Concern  . Not on file  Social History Narrative  . Not on file    Allergies:  Allergies  Allergen Reactions  . Bee Venom Hives and Swelling  . Other Nausea And Vomiting    scallops    Medications: Prior to Admission medications   Medication Sig Start Date End Date Taking? Authorizing Provider  acetaminophen (TYLENOL) 500 MG tablet Take 500-1,000 mg by mouth every 6 (six) hours as needed for mild pain or  moderate pain.   Yes [provider]  calcium carbonate (TUMS - DOSED IN MG ELEMENTAL CALCIUM) 500 MG chewable tablet Chew 2 tablets by mouth daily as needed for indigestion or heartburn.   Yes [provider]  HYDROcodone-acetaminophen (NORCO/VICODIN) 5-325 MG tablet Take 1 tablet by mouth every 8 (eight) hours as needed (breakthrough pain). 03/12/18  Yes Will Bonnet, MD  ibuprofen (ADVIL,MOTRIN) 800 MG tablet Take 1 tablet (800 mg total) by mouth every 8 (eight) hours as needed. 03/12/18  Yes Will Bonnet, MD  medroxyPROGESTERone (PROVERA) 10 MG tablet Take 4 tablets (40 mg total) by mouth daily for 4 days. 03/17/18 03/21/18 Yes [place], [place], MD  Multiple Vitamin (MULTIVITAMIN WITH MINERALS) TABS tablet Take 1 tablet by mouth once a week.   Yes [provider]  omeprazole (PRILOSEC) 40 MG capsule Take 40 mg by mouth daily.    Yes [provider]  nitrofurantoin, macrocrystal-monohydrate, (MACROBID) 100 MG capsule Take 1 capsule (100 mg total) by mouth 2 (two) times daily for 5 days. 03/18/18 03/23/18  Malachy Mood, MD    Physical Exam Vitals:  Vitals:   03/18/18 0847  BP: 130/90   No LMP recorded (lmp unknown).  General: NAD HEENT: normocephalic, anicteric Pulmonary: No increased work of breathing Abdomen: NABS, soft, non-tender, non-distended.  Umbilicus without lesions.  No  Extremities: no edema, erythema, or tenderness Neurologic: Grossly intact Psychiatric: mood appropriate, affect full  Female chaperone present for pelvic  portions of the physical exam  Ct Abdomen Pelvis W Contrast  Result Date: 03/17/2018 CLINICAL DATA:  D&C this past Thursday now with vaginal bleeding, abdominal pain and chills. EXAM: CT ABDOMEN AND PELVIS WITH CONTRAST TECHNIQUE: Multidetector CT imaging of the abdomen and pelvis was performed using the standard protocol following bolus administration of intravenous contrast. CONTRAST:  156mL  ISOVUE-300 IOPAMIDOL (ISOVUE-300) INJECTION 61% COMPARISON:  CT abdomen pelvis-12/20/2013 FINDINGS: Lower chest: Limited visualization of the lower thorax is negative for focal airspace opacity or pleural effusion. Normal heart size.  No pericardial effusion. Hepatobiliary: Normal hepatic contour. There is diffuse decreased attenuation of the hepatic parenchyma on this postcontrast examination suggestive of hepatic steatosis. No discrete hepatic lesions. Normal appearance of the gallbladder given degree distention. No radiopaque gallstones. No intra or extrahepatic biliary ductal dilatation. No ascites. Pancreas: Normal appearance of the pancreas Spleen: Normal appearance of the spleen Adrenals/Urinary Tract: There is symmetric enhancement and excretion of the bilateral kidneys. No definite renal stones on this postcontrast examination. No discrete renal lesions. No urine obstruction or perinephric stranding. Normal appearance the bilateral adrenal glands. There is mild diffuse thickening of the urinary bladder wall with minimal amount  of adjacent stranding, most conspicuous about the left side of the urinary bladder (image 81, series 2; coronal image 44, series 5). Stomach/Bowel: Moderate colonic stool burden without evidence of enteric obstruction. Normal appearance of the terminal ileum and retrocecal appendix. Radiopaque pill fragments are seen within the cecum. The bowel is normal in course and caliber without discrete area of wall thickening. No pneumoperitoneum, pneumatosis or portal venous gas. Vascular/Lymphatic: Normal caliber of the abdominal aorta. The major branch vessels of the abdominal aorta appear patent on this non CTA examination. 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 No bulky retroperitoneal, mesenteric, pelvic or inguinal lymphadenopathy. Reproductive: Note is made of an approximately 2.5 x 1.5 cm hypoattenuating right-sided presumably physiologic adnexal cyst. No discrete  left-sided adnexal lesion. Normal appearance of the uterus. There is a trace amount of fluid within the pelvic cul-de-sac, presumably physiologic. Other: There is a minimal amount of subcutaneous edema about the midline of the low back. Musculoskeletal: No acute or aggressive osseous abnormalities. IMPRESSION: 1. Apparent thickening of the urinary bladder wall with associated minimal amount of stranding, nonspecific though could be seen in the setting of a cystitis. Correlation with urinalysis is advised. No evidence of urinary obstruction or CT evidence of pyelonephritis. 2. Otherwise, no explanation for patient's abdominal pain and chills. Specifically, no definitive evidence of complication associated with recent D & C procedure. 3. Suspected hepatic steatosis.  Correlation with LFTs is advised. Electronically Signed   By: Sandi Mariscal M.D.   On: 03/17/2018 15:38     Assessment: 37 y.o. G0P0000 ER follow up UTI  Plan: Problem List Items Addressed This Visit    None    Visit Diagnoses    Dysuria    -  Primary   Relevant Orders   Urine Culture     1) Possible UTI - CT imaging in ER with bladder wall thickening otherwise negative imaging.  Will send urine culture.  Has Rx for Keflex but discussed preference for Macrobid as more urinary tract specific and since she has not yet filled the Keflex.  2) Discussed expected improvement in 24-48hrs but to finish antibiotic course.  3) AUB - continue provera for now.  Her pain may also be dysmenorrhea.  Discussed that if she was on provera preoperatively and discontinued postoperatively this may simply be related to progestin withdrawal bleeding.  Discussed that Dr. Glennon Mac will further discuss management options at time of postop visit but IUD may also be a reasonable management option to pursue.  4) A total of 15 minutes were spent in face-to-face contact with the patient during this encounter with over half of that time devoted to counseling and  coordination of care.   Malachy Mood, MD, [facility], [facility] 03/18/2018, 10:57 AM   Urine culture macrobid

## 2018-03-20 LAB — URINE CULTURE

## 2018-03-24 ENCOUNTER — Ambulatory Visit (INDEPENDENT_AMBULATORY_CARE_PROVIDER_SITE_OTHER): Payer: Self-pay | Admitting: Obstetrics and Gynecology

## 2018-03-24 ENCOUNTER — Encounter: Payer: Self-pay | Admitting: Obstetrics and Gynecology

## 2018-03-24 VITALS — BP 110/80 | Ht 66.0 in | Wt 242.0 lb

## 2018-03-24 DIAGNOSIS — N921 Excessive and frequent menstruation with irregular cycle: Secondary | ICD-10-CM

## 2018-03-24 DIAGNOSIS — Z09 Encounter for follow-up examination after completed treatment for conditions other than malignant neoplasm: Secondary | ICD-10-CM

## 2018-03-24 DIAGNOSIS — N840 Polyp of corpus uteri: Secondary | ICD-10-CM

## 2018-03-24 NOTE — Progress Notes (Signed)
   Postoperative Follow-up Patient presents post op from hysteroscopy, D&C, polypectomy  12 days ago for abnormal uterine bleeding -polyp.  Subjective: Patient reports marked improvement in her preop symptoms. Eating a regular diet without difficulty. The patient is not having any pain.  Activity: normal activities of daily living.  She went to the ER for abdominal pain and feeling faint. Workup was negative for surgical complication. She was started on provera and has stopped that. She has had no bleeding in 5 days. Feels much better. Is still taking medication for UTI, but culture was negative (she had already received  Rocephin in the ER) the night prior.   Objective: Vitals:   03/24/18 0954  BP: 110/80   Vital Signs: BP 110/80   Ht 5\' 6"  (1.676 m)   Wt 242 lb (109.8 kg)   LMP  (LMP Unknown) Comment: neg preg test 03/17/18  BMI 39.06 kg/m  Constitutional: Well nourished, well developed female in no acute distress.  HEENT: normal Skin: Warm and dry.  Extremity: no edema  Abdomen: Soft, non-tender, normal bowel sounds; no bruits, organomegaly or masses.  Assessment: 37 y.o. s/p hysteroscopy, D&C, polypectomy progressing well  Plan: Patient has done well after surgery with no apparent complications.  I have discussed the post-operative course to date, and the expected progress moving forward.  The patient understands what complications to be concerned about.  I will see the patient in routine follow up, or sooner if needed.    Activity plan: No restriction.  Prentice Docker, MD 03/24/2018, 10:15 AM

## 2018-04-29 ENCOUNTER — Emergency Department: Payer: Self-pay

## 2018-04-29 ENCOUNTER — Other Ambulatory Visit: Payer: Self-pay

## 2018-04-29 DIAGNOSIS — Z79899 Other long term (current) drug therapy: Secondary | ICD-10-CM | POA: Insufficient documentation

## 2018-04-29 DIAGNOSIS — J101 Influenza due to other identified influenza virus with other respiratory manifestations: Secondary | ICD-10-CM | POA: Insufficient documentation

## 2018-04-29 DIAGNOSIS — F17210 Nicotine dependence, cigarettes, uncomplicated: Secondary | ICD-10-CM | POA: Insufficient documentation

## 2018-04-29 LAB — POCT PREGNANCY, URINE: Preg Test, Ur: NEGATIVE

## 2018-04-29 NOTE — ED Triage Notes (Addendum)
Pt arrives to ED via POV from home with c/o non-productive cough x1 day. Pt denies N/V/D, no fever. Pt reports taking OTC medications without relief. Pt also requesting a pregnancy test and stating "I'm late for my period".

## 2018-04-30 ENCOUNTER — Emergency Department
Admission: EM | Admit: 2018-04-30 | Discharge: 2018-04-30 | Disposition: A | Discharge status: Home / Self Care | Payer: Self-pay | Attending: Emergency Medicine | Admitting: Emergency Medicine

## 2018-04-30 ENCOUNTER — Emergency Department: Payer: Self-pay

## 2018-04-30 ENCOUNTER — Emergency Department
Admission: EM | Admit: 2018-04-30 | Discharge: 2018-05-01 | Disposition: A | Discharge status: Home / Self Care | Payer: Self-pay | Attending: Emergency Medicine | Admitting: Emergency Medicine

## 2018-04-30 DIAGNOSIS — F17210 Nicotine dependence, cigarettes, uncomplicated: Secondary | ICD-10-CM | POA: Insufficient documentation

## 2018-04-30 DIAGNOSIS — T7840XA Allergy, unspecified, initial encounter: Secondary | ICD-10-CM | POA: Insufficient documentation

## 2018-04-30 DIAGNOSIS — R05 Cough: Secondary | ICD-10-CM | POA: Insufficient documentation

## 2018-04-30 DIAGNOSIS — J101 Influenza due to other identified influenza virus with other respiratory manifestations: Secondary | ICD-10-CM

## 2018-04-30 DIAGNOSIS — J111 Influenza due to unidentified influenza virus with other respiratory manifestations: Secondary | ICD-10-CM | POA: Insufficient documentation

## 2018-04-30 LAB — INFLUENZA PANEL BY PCR (TYPE A & B)
INFLBPCR: POSITIVE — AB
Influenza A By PCR: NEGATIVE

## 2018-04-30 MED ORDER — SODIUM CHLORIDE 0.9 % IV BOLUS
1000.0000 mL | Freq: Once | INTRAVENOUS | Status: AC
  Administered 2018-04-30: 1000 mL via INTRAVENOUS

## 2018-04-30 MED ORDER — METHYLPREDNISOLONE SODIUM SUCC 125 MG IJ SOLR
125.0000 mg | Freq: Once | INTRAMUSCULAR | Status: AC
  Administered 2018-04-30: 125 mg via INTRAVENOUS
  Filled 2018-04-30: qty 2

## 2018-04-30 MED ORDER — DIPHENHYDRAMINE HCL 50 MG/ML IJ SOLN
25.0000 mg | Freq: Once | INTRAMUSCULAR | Status: AC
  Administered 2018-05-01: 25 mg via INTRAVENOUS
  Filled 2018-04-30: qty 1

## 2018-04-30 MED ORDER — ALBUTEROL SULFATE HFA 108 (90 BASE) MCG/ACT IN AERS: 2.0000 | INHALATION_SPRAY | RESPIRATORY_TRACT | 0 refills | Status: DC | PRN

## 2018-04-30 MED ORDER — FAMOTIDINE IN NACL 20-0.9 MG/50ML-% IV SOLN
20.0000 mg | Freq: Once | INTRAVENOUS | Status: AC
  Administered 2018-04-30: 20 mg via INTRAVENOUS
  Filled 2018-04-30: qty 50

## 2018-04-30 MED ORDER — HYDROCOD POLST-CPM POLST ER 10-8 MG/5ML PO SUER
5.0000 mL | Freq: Once | ORAL | Status: AC
  Administered 2018-04-30: 5 mL via ORAL
  Filled 2018-04-30: qty 5

## 2018-04-30 MED ORDER — OSELTAMIVIR PHOSPHATE 75 MG PO CAPS
75.0000 mg | ORAL_CAPSULE | Freq: Once | ORAL | Status: AC
  Administered 2018-04-30: 75 mg via ORAL
  Filled 2018-04-30: qty 1

## 2018-04-30 MED ORDER — PREDNISONE 20 MG PO TABS: 60.0000 mg | ORAL_TABLET | Freq: Every day | ORAL | 0 refills | Status: AC

## 2018-04-30 MED ORDER — DIPHENHYDRAMINE HCL 25 MG PO TABS: 50.0000 mg | ORAL_TABLET | Freq: Four times a day (QID) | ORAL | 0 refills | Status: DC

## 2018-04-30 MED ORDER — BENZONATATE 100 MG PO CAPS: 100.0000 mg | ORAL_CAPSULE | Freq: Three times a day (TID) | ORAL | 0 refills | Status: DC | PRN

## 2018-04-30 MED ORDER — EPINEPHRINE 0.3 MG/0.3ML IJ SOAJ: 0.3000 mg | Freq: Once | INTRAMUSCULAR | 2 refills | Status: AC

## 2018-04-30 MED ORDER — BENZONATATE 100 MG PO CAPS: 100.0000 mg | ORAL_CAPSULE | Freq: Three times a day (TID) | ORAL | 0 refills | Status: AC | PRN

## 2018-04-30 MED ORDER — DIPHENHYDRAMINE HCL 50 MG/ML IJ SOLN
25.0000 mg | Freq: Once | INTRAMUSCULAR | Status: AC
  Administered 2018-04-30: 25 mg via INTRAVENOUS
  Filled 2018-04-30: qty 1

## 2018-04-30 MED ORDER — OSELTAMIVIR PHOSPHATE 75 MG PO CAPS: 75.0000 mg | ORAL_CAPSULE | Freq: Two times a day (BID) | ORAL | 0 refills | Status: AC

## 2018-04-30 NOTE — ED Notes (Signed)
ED Provider at bedside. 

## 2018-04-30 NOTE — ED Notes (Signed)
Patient reports increased itching. Patient up to toilet. MD [facility] informed.

## 2018-04-30 NOTE — ED Provider Notes (Signed)
[facility] Emergency Department Provider Note  ____________________________________________  Time seen: Approximately 11:45 PM  I have reviewed the triage vital signs and the nursing notes.   HISTORY  Chief Complaint Allergic Reaction    HPI Valerie Massey is a 37 y.o. female recently diagnosed with influenza, started Tamiflu earlier today who started developing rash on bilateral hands and her trunk at approximately 9 PM today.  She also feels discomfort with swallowing but denies tongue or throat swelling.  She has some cough and difficulty breathing that is been present since yesterday prior to Tamiflu.  Denies any vomiting.  No known allergies.  Symptoms are constant since onset, gradually worsening, no aggravating or alleviating factors.  No pain      Past Medical History:  Diagnosis Date  . Anemia   . Depression   . History of hiatal hernia   . Migraines      Patient Active Problem List   Diagnosis Date Noted  . Endometrial polyp 02/10/2018  . Menorrhagia with irregular cycle 01/28/2018     Past Surgical History:  Procedure Laterality Date  . DILATATION & CURETTAGE/HYSTEROSCOPY WITH MYOSURE N/A 03/12/2018   Procedure: DILATATION & CURETTAGE/HYSTEROSCOPY WITH MYOSURE;  Surgeon: Will Bonnet, MD;  Location: [facility] ORS;  Service: Gynecology;  Laterality: N/A;  . WISDOM TOOTH EXTRACTION       Prior to Admission medications   Medication Sig Start Date End Date Taking? Authorizing Provider  acetaminophen (TYLENOL) 500 MG tablet Take 500-1,000 mg by mouth every 6 (six) hours as needed for mild pain or moderate pain.    [provider]  albuterol (PROVENTIL HFA) 108 (90 Base) MCG/ACT inhaler Inhale 2 puffs into the lungs every 4 (four) hours as needed for wheezing or shortness of breath. 04/30/18   Carrie Mew, MD  benzonatate (TESSALON PERLES) 100 MG capsule Take 1 capsule (100 mg total) by mouth 3 (three) times daily as  needed for cough. 04/30/18 04/30/19  Gregor Hams, MD  calcium carbonate (TUMS - DOSED IN MG ELEMENTAL CALCIUM) 500 MG chewable tablet Chew 2 tablets by mouth daily as needed for indigestion or heartburn.    [provider]  diphenhydrAMINE (BENADRYL) 25 MG tablet Take 2 tablets (50 mg total) by mouth every 6 (six) hours for 3 days. 04/30/18 05/03/18  Carrie Mew, MD  EPINEPHrine 0.3 mg/0.3 mL IJ SOAJ injection Inject 0.3 mLs (0.3 mg total) into the muscle once for 1 dose. Follow package instructions as needed for severe allergy or anaphylactic reaction. 04/30/18 04/30/18  Carrie Mew, MD  HYDROcodone-acetaminophen (NORCO/VICODIN) 5-325 MG tablet Take 1 tablet by mouth every 8 (eight) hours as needed (breakthrough pain). 03/12/18   Will Bonnet, MD  ibuprofen (ADVIL,MOTRIN) 800 MG tablet Take 1 tablet (800 mg total) by mouth every 8 (eight) hours as needed. 03/12/18   Will Bonnet, MD  medroxyPROGESTERone (PROVERA) 10 MG tablet Take 4 tablets (40 mg total) by mouth daily for 4 days. 03/17/18 03/21/18  Rudene Re, MD  Multiple Vitamin (MULTIVITAMIN WITH MINERALS) TABS tablet Take 1 tablet by mouth once a week.    [provider]  omeprazole (PRILOSEC) 40 MG capsule Take 40 mg by mouth daily.     [provider]  oseltamivir (TAMIFLU) 75 MG capsule Take 1 capsule (75 mg total) by mouth 2 (two) times daily for 9 days. 04/30/18 05/09/18  Gregor Hams, MD  predniSONE (DELTASONE) 20 MG tablet Take 3 tablets (60 mg total) by mouth  daily for 3 days. 04/30/18 05/03/18  Carrie Mew, MD     Allergies Bee venom and Other   Family History  Problem Relation Age of Onset  . Breast cancer Mother 63  . Ovarian cancer Mother 80  . Endometrial cancer Maternal Grandmother 71  . Lung cancer Maternal Grandfather 60  . Lung cancer Paternal Grandfather 97    Social History Social History   Tobacco Use  . Smoking status: Current Every Day  Smoker    Packs/day: 0.50    Types: Cigarettes    Start date: 05/06/1997  . Smokeless tobacco: Never Used  Substance Use Topics  . Alcohol use: Yes    Comment: 1 q month  . Drug use: Never    Review of Systems  Constitutional:   Positive fever.  ENT:   No sore throat. No rhinorrhea. Cardiovascular:   No chest pain or syncope. Respiratory: Positive shortness of breath and nonproductive cough. Gastrointestinal:   Negative for abdominal pain, vomiting and diarrhea.  Musculoskeletal:   Negative for focal pain or swelling All other systems reviewed and are negative except as documented above in ROS and HPI.  ____________________________________________   PHYSICAL EXAM:  VITAL SIGNS: ED Triage Vitals  Enc Vitals Group     BP 04/30/18 2140 136/80     Pulse Rate 04/30/18 2140 (!) 111     Resp 04/30/18 2140 19     Temp 04/30/18 2140 (!) 101.4 F (38.6 C)     Temp Source 04/30/18 2140 Oral     SpO2 04/30/18 2140 92 %     Weight 04/30/18 2141 240 lb 4.8 oz (109 kg)     Height --      Head Circumference --      Peak Flow --      Pain Score 04/30/18 2141 7     Pain Loc --      Pain Edu? --      Excl. in [place]? --     Vital signs reviewed, nursing assessments reviewed.   Constitutional:   Alert and oriented. Non-toxic appearance. Eyes:   Conjunctivae are normal. EOMI. PERRL. ENT      Head:   Normocephalic and atraumatic.      Nose:   No congestion/rhinnorhea.       Mouth/Throat:   MMM, no pharyngeal erythema. No peritonsillar mass.  No tongue swelling or floor mouth edema, no uvula edema      Neck:   No meningismus. Full ROM. Hematological/Lymphatic/Immunilogical:   No cervical lymphadenopathy. Cardiovascular:   Mild tachycardia of 100. Symmetric bilateral radial and DP pulses.  No murmurs. Cap refill less than 2 seconds. Respiratory:   Normal respiratory effort without tachypnea/retractions. Breath sounds are clear and equal bilaterally. No  wheezes/rales/rhonchi. Gastrointestinal:   Soft and nontender. Non distended. There is no CVA tenderness.  No rebound, rigidity, or guarding.  Musculoskeletal:   Normal range of motion in all extremities. No joint effusions.  No lower extremity tenderness.  No edema. Neurologic:   Normal speech and language.  Motor grossly intact. No acute focal neurologic deficits are appreciated.  Skin:    Skin is warm, dry and intact.  Urticarial rash on bilateral dorsal hands as well as scattered on the trunk.  No petechia purpura or bullae.  Nikolsky negative. ____________________________________________    LABS (pertinent positives/negatives) (all labs ordered are listed, but only abnormal results are displayed) Labs Reviewed - No data to display ____________________________________________   EKG    ____________________________________________  RADIOLOGY  Dg Chest 2 View  Result Date: 04/29/2018 CLINICAL DATA:  Acute onset of nonproductive cough. EXAM: CHEST - 2 VIEW COMPARISON:  Thoracic spine radiographs performed 11/07/2017 FINDINGS: The lungs are well-aerated and clear. There is no evidence of focal opacification, pleural effusion or pneumothorax. The heart is normal in size; the mediastinal contour is within normal limits. No acute osseous abnormalities are seen. IMPRESSION: No acute cardiopulmonary process seen. Electronically Signed   By: Garald Balding M.D.   On: 04/29/2018 23:59   Dg Chest Portable 1 View  Result Date: 04/30/2018 CLINICAL DATA:  Dyspnea and cough EXAM: PORTABLE CHEST 1 VIEW COMPARISON:  04/29/2018 CXR FINDINGS: The heart size and mediastinal contours are within normal limits. Both lungs are clear. The visualized skeletal structures are unremarkable. IMPRESSION: No active disease. Electronically Signed   By: Ashley Royalty M.D.   On: 04/30/2018 22:16     ____________________________________________   PROCEDURES Procedures  ____________________________________________    CLINICAL IMPRESSION / ASSESSMENT AND PLAN / ED COURSE  Pertinent labs & imaging results that were available during my care of the patient were reviewed by me and considered in my medical decision making (see chart for details).      Clinical Course as of Apr 30 2344  Thu Apr 30, 2018  2152 Allergic reaction to likely tamiflu.not anaphylaxis. So2 borderline, will get cxr. Benadryl, pepcid, solumedrol. Had 25mg  benadryl po 1 hr pta.   [PS]    Clinical Course User Index [PS] Carrie Mew, MD     ----------------------------------------- 11:47 PM on 04/30/2018 -----------------------------------------  Chest x-ray unremarkable.  Symptomatically improved, vital signs normal.  Patient knows to discontinue the Tamiflu.  Hopefully we can successfully mitigate her viral respiratory symptoms and influenza infection with prednisone and albuterol.  Return precautions given.  ____________________________________________   FINAL CLINICAL IMPRESSION(S) / ED DIAGNOSES    Final diagnoses:  Allergic reaction, initial encounter  Influenza     ED Discharge Orders         Ordered    predniSONE (DELTASONE) 20 MG tablet  Daily     04/30/18 2343    diphenhydrAMINE (BENADRYL) 25 MG tablet  Every 6 hours     04/30/18 2343    albuterol (PROVENTIL HFA) 108 (90 Base) MCG/ACT inhaler  Every 4 hours PRN     04/30/18 2343    EPINEPHrine 0.3 mg/0.3 mL IJ SOAJ injection   Once     04/30/18 2343          Portions of this note were generated with dragon dictation software. Dictation errors may occur despite best attempts at proofreading.   Carrie Mew, MD 04/30/18 2348

## 2018-04-30 NOTE — ED Notes (Signed)
Patient report dx of flu started on tamaflu had first dose in ER with no reaction, took second dose this evening and began to welt up and feel sob. Patient able to hold full conversation, petichea and redness noted to bilateral hands/ches and around patient neck. Patient took 25mg  benadryl prior to ed arrival.

## 2018-04-30 NOTE — ED Provider Notes (Signed)
[facility] Emergency Department Provider Note   First MD Initiated Contact with Patient 04/30/18 [phone]     (approximate)  I have reviewed the triage vital signs and the nursing notes.   HISTORY  Chief Complaint Cough    HPI Valerie Massey is a 37 y.o. female with below list of chronic medical conditions presents to the emergency department with a 1 day history of nonproductive cough and subjective fever.  Patient does admit to "less than a half a pack cigarette use per day".  Patient denies any known sick contact.  Patient not receive flu vaccination this year.   Past Medical History:  Diagnosis Date  . Anemia   . Depression   . History of hiatal hernia   . Migraines     Patient Active Problem List   Diagnosis Date Noted  . Endometrial polyp 02/10/2018  . Menorrhagia with irregular cycle 01/28/2018    Past Surgical History:  Procedure Laterality Date  . DILATATION & CURETTAGE/HYSTEROSCOPY WITH MYOSURE N/A 03/12/2018   Procedure: DILATATION & CURETTAGE/HYSTEROSCOPY WITH MYOSURE;  Surgeon: Will Bonnet, MD;  Location: [facility] ORS;  Service: Gynecology;  Laterality: N/A;  . WISDOM TOOTH EXTRACTION      Prior to Admission medications   Medication Sig Start Date End Date Taking? Authorizing Provider  acetaminophen (TYLENOL) 500 MG tablet Take 500-1,000 mg by mouth every 6 (six) hours as needed for mild pain or moderate pain.    [provider]  calcium carbonate (TUMS - DOSED IN MG ELEMENTAL CALCIUM) 500 MG chewable tablet Chew 2 tablets by mouth daily as needed for indigestion or heartburn.    [provider]  HYDROcodone-acetaminophen (NORCO/VICODIN) 5-325 MG tablet Take 1 tablet by mouth every 8 (eight) hours as needed (breakthrough pain). 03/12/18   Will Bonnet, MD  ibuprofen (ADVIL,MOTRIN) 800 MG tablet Take 1 tablet (800 mg total) by mouth every 8 (eight) hours as needed. 03/12/18   Will Bonnet, MD    medroxyPROGESTERone (PROVERA) 10 MG tablet Take 4 tablets (40 mg total) by mouth daily for 4 days. 03/17/18 03/21/18  Rudene Re, MD  Multiple Vitamin (MULTIVITAMIN WITH MINERALS) TABS tablet Take 1 tablet by mouth once a week.    [provider]  omeprazole (PRILOSEC) 40 MG capsule Take 40 mg by mouth daily.     [provider]    Allergies Bee venom and Other  Family History  Problem Relation Age of Onset  . Breast cancer Mother 71  . Ovarian cancer Mother 51  . Endometrial cancer Maternal Grandmother 82  . Lung cancer Maternal Grandfather 60  . Lung cancer Paternal Grandfather 69    Social History Social History   Tobacco Use  . Smoking status: Current Every Day Smoker    Packs/day: 0.50    Types: Cigarettes    Start date: 05/06/1997  . Smokeless tobacco: Never Used  Substance Use Topics  . Alcohol use: Yes    Comment: 1 q month  . Drug use: Never    Review of Systems Constitutional: Positive for subjective fever Eyes: No visual changes. ENT: No sore throat. Cardiovascular: Denies chest pain. Respiratory: Denies shortness of breath.  Positive for cough Gastrointestinal: No abdominal pain.  No nausea, no vomiting.  No diarrhea.  No constipation. Genitourinary: Negative for dysuria. Musculoskeletal: Negative for neck pain.  Negative for back pain. Integumentary: Negative for rash. Neurological: Negative for headaches, focal weakness or numbness.   ____________________________________________   PHYSICAL EXAM:  VITAL SIGNS: ED Triage Vitals [04/29/18 2316]  Enc Vitals Group     BP (!) 182/80     Pulse Rate 96     Resp 17     Temp 99.3 F (37.4 C)     Temp Source Oral     SpO2 97 %     Weight 108.9 kg (240 lb)     Height 1.676 m (5\' 6" )     Head Circumference      Peak Flow      Pain Score 6     Pain Loc      Pain Edu?      Excl. in [place]?     Constitutional: Alert and oriented.  Coughing Eyes: Conjunctivae are normal.   Mouth/Throat: Mucous membranes are moist.  Oropharynx non-erythematous. Neck: No stridor. Cardiovascular: Normal rate, regular rhythm. Good peripheral circulation. Grossly normal heart sounds. Respiratory: Normal respiratory effort.  No retractions. Lungs CTAB. Gastrointestinal: Soft and nontender. No distention.  Musculoskeletal: No lower extremity tenderness nor edema. No gross deformities of extremities. Neurologic:  Normal speech and language. No gross focal neurologic deficits are appreciated.  Skin:  Skin is warm, dry and intact. No rash noted. Psychiatric: Mood and affect are normal. Speech and behavior are normal.  ____________________________________________   LABS (all labs ordered are listed, but only abnormal results are displayed)  Labs Reviewed  INFLUENZA PANEL BY PCR (TYPE A & B)  POCT PREGNANCY, URINE   _________________________  RADIOLOGY I, [place], personally viewed and evaluated these images (plain radiographs) as part of my medical decision making, as well as reviewing the written report by the radiologist.  ED MD interpretation: No acute cardiopulmonary process seen on chest x-ray per radiologist.  Official radiology report(s): Dg Chest 2 View  Result Date: 04/29/2018 CLINICAL DATA:  Acute onset of nonproductive cough. EXAM: CHEST - 2 VIEW COMPARISON:  Thoracic spine radiographs performed 11/07/2017 FINDINGS: The lungs are well-aerated and clear. There is no evidence of focal opacification, pleural effusion or pneumothorax. The heart is normal in size; the mediastinal contour is within normal limits. No acute osseous abnormalities are seen. IMPRESSION: No acute cardiopulmonary process seen. Electronically Signed   By: Garald Balding M.D.   On: 04/29/2018 23:59    _______________________  Procedures   ____________________________________________   INITIAL IMPRESSION / ASSESSMENT AND PLAN / ED COURSE  As part of my medical decision making, I  reviewed the following data within the [place]   _33 year old female presented with above-stated history and physical exam secondary to beforementioned symptoms.  Chest x-ray revealed no evidence of pneumonia however concern for possible influenza and as such swab was obtained which is positive for influenza B spoke to patient at length regarding Tamiflu which she requested.  Patient was given Tamiflu in the emergency department will be prescribed same for home as well as Tessalon Perles.  ___________________________________________  FINAL CLINICAL IMPRESSION(S) / ED DIAGNOSES  Final diagnoses:  Influenza B     MEDICATIONS GIVEN DURING THIS VISIT:  Medications - No data to display   ED Discharge Orders    None       Note:  This document was prepared using Dragon voice recognition software and may include unintentional dictation errors.    Gregor Hams, MD 04/30/18 2222

## 2018-04-30 NOTE — ED Notes (Signed)
pcxr completed.

## 2018-04-30 NOTE — ED Triage Notes (Signed)
Patient c/o possible allergic reaction to tamiflu. patient took 25 mg at 2030. Patient c/o rash, throat tightness, and facial swelling.

## 2018-04-30 NOTE — Discharge Instructions (Signed)
Continue taking Benadryl and prednisone for the next several days to control allergy symptoms and your respiratory symptoms.  Albuterol inhaler can also help.  You should also keep an EpiPen available at all times in case you have recurrent worse allergic symptoms that cause difficulty breathing or throat swelling.

## 2018-05-01 NOTE — ED Notes (Signed)
Reviewed discharge instructions, follow-up care, and prescriptions with patient. Patient verbalized understanding of all information reviewed. Patient stable, with no distress noted at this time.    

## 2018-07-25 ENCOUNTER — Emergency Department
Admission: EM | Admit: 2018-07-25 | Discharge: 2018-07-25 | Disposition: A | Discharge status: Home / Self Care | Payer: Self-pay | Attending: Emergency Medicine | Admitting: Emergency Medicine

## 2018-07-25 ENCOUNTER — Encounter: Payer: Self-pay | Admitting: Emergency Medicine

## 2018-07-25 ENCOUNTER — Emergency Department: Payer: Self-pay

## 2018-07-25 ENCOUNTER — Other Ambulatory Visit: Payer: Self-pay

## 2018-07-25 DIAGNOSIS — Z79899 Other long term (current) drug therapy: Secondary | ICD-10-CM | POA: Insufficient documentation

## 2018-07-25 DIAGNOSIS — R059 Cough, unspecified: Secondary | ICD-10-CM

## 2018-07-25 DIAGNOSIS — K219 Gastro-esophageal reflux disease without esophagitis: Secondary | ICD-10-CM | POA: Insufficient documentation

## 2018-07-25 DIAGNOSIS — R05 Cough: Secondary | ICD-10-CM | POA: Insufficient documentation

## 2018-07-25 DIAGNOSIS — F17210 Nicotine dependence, cigarettes, uncomplicated: Secondary | ICD-10-CM | POA: Insufficient documentation

## 2018-07-25 MED ORDER — AMOXICILLIN-POT CLAVULANATE 875-125 MG PO TABS: 1.0000 | ORAL_TABLET | Freq: Two times a day (BID) | ORAL | 0 refills | Status: AC

## 2018-07-25 NOTE — Discharge Instructions (Addendum)
Please return if you are worse.  Please return especially if you get a fever or shortness of breath.  Use the albuterol inhaler 2 puffs 4 times a day if needed and take the Augmentin 1 twice a day with food.  Occasionally Augmentin will give adults diarrhea.  If it happens to you please return here.

## 2018-07-25 NOTE — ED Notes (Signed)
States she has had a productive cough that has gotten progressively worse since Thursday

## 2018-07-25 NOTE — ED Notes (Signed)
Albuterol inhaler called into [facility] in [place] on behalf of Dr Archie Balboa who attempted to e-scribe the script but was unable to do so

## 2018-07-25 NOTE — ED Triage Notes (Signed)
Pt to ED via POV, pt states that she has hx/o acid reflux, Thursday night she woke up and was choking on acid. Pt thinks she may have aspirated. Pt states that she feels like she has bronchitis. Pt is in NAD at this time. Was ambulatory to room without difficulty.

## 2018-07-25 NOTE — ED Provider Notes (Signed)
[facility] Emergency Department Provider Note   ____________________________________________   First MD Initiated Contact with Patient 07/25/18 1438     (approximate)  I have reviewed the triage vital signs and the nursing notes.   HISTORY  Chief Complaint Cough and Gastroesophageal Reflux    HPI Valerie Massey is a 38 y.o. female who reports she had reflux.  Yesterday she thought she might have choked and aspirated.  She is developed a cough since then.  She had a temperature of 99 this morning.  She has not traveled.  Cough is productive of small amounts of greenish phlegm.  She works in Geologist, engineering at [employer].  I will give her off for couple days to make sure she is not going to give anybody a bronchitis type infection.        Past Medical History:  Diagnosis Date  . Anemia   . Depression   . History of hiatal hernia   . Migraines     Patient Active Problem List   Diagnosis Date Noted  . Endometrial polyp 02/10/2018  . Menorrhagia with irregular cycle 01/28/2018    Past Surgical History:  Procedure Laterality Date  . DILATATION & CURETTAGE/HYSTEROSCOPY WITH MYOSURE N/A 03/12/2018   Procedure: DILATATION & CURETTAGE/HYSTEROSCOPY WITH MYOSURE;  Surgeon: Will Bonnet, MD;  Location: [facility] ORS;  Service: Gynecology;  Laterality: N/A;  . WISDOM TOOTH EXTRACTION      Prior to Admission medications   Medication Sig Start Date End Date Taking? Authorizing Provider  acetaminophen (TYLENOL) 500 MG tablet Take 500-1,000 mg by mouth every 6 (six) hours as needed for mild pain or moderate pain.    [provider]  albuterol (PROVENTIL HFA) 108 (90 Base) MCG/ACT inhaler Inhale 2 puffs into the lungs every 4 (four) hours as needed for wheezing or shortness of breath. 04/30/18   Carrie Mew, MD  benzonatate (TESSALON PERLES) 100 MG capsule Take 1 capsule (100 mg total) by mouth 3 (three) times daily as needed for cough. 04/30/18  04/30/19  Gregor Hams, MD  calcium carbonate (TUMS - DOSED IN MG ELEMENTAL CALCIUM) 500 MG chewable tablet Chew 2 tablets by mouth daily as needed for indigestion or heartburn.    [provider]  diphenhydrAMINE (BENADRYL) 25 MG tablet Take 2 tablets (50 mg total) by mouth every 6 (six) hours for 3 days. 04/30/18 05/03/18  Carrie Mew, MD  HYDROcodone-acetaminophen (NORCO/VICODIN) 5-325 MG tablet Take 1 tablet by mouth every 8 (eight) hours as needed (breakthrough pain). 03/12/18   Will Bonnet, MD  ibuprofen (ADVIL,MOTRIN) 800 MG tablet Take 1 tablet (800 mg total) by mouth every 8 (eight) hours as needed. 03/12/18   Will Bonnet, MD  medroxyPROGESTERone (PROVERA) 10 MG tablet Take 4 tablets (40 mg total) by mouth daily for 4 days. 03/17/18 03/21/18  Rudene Re, MD  Multiple Vitamin (MULTIVITAMIN WITH MINERALS) TABS tablet Take 1 tablet by mouth once a week.    [provider]  omeprazole (PRILOSEC) 40 MG capsule Take 40 mg by mouth daily.     [provider]    Allergies Bee venom; Other; and Tamiflu [oseltamivir phosphate]  Family History  Problem Relation Age of Onset  . Breast cancer Mother 67  . Ovarian cancer Mother 57  . Endometrial cancer Maternal Grandmother 40  . Lung cancer Maternal Grandfather 60  . Lung cancer Paternal Grandfather 30    Social History Social History   Tobacco Use  .  Smoking status: Current Every Day Smoker    Packs/day: 0.50    Types: Cigarettes    Start date: 05/06/1997  . Smokeless tobacco: Never Used  Substance Use Topics  . Alcohol use: Yes    Comment: 1 q month  . Drug use: Never    Review of Systems  Constitutional: No fever/chills except temperature of 99 immediately prior to arrival Eyes: No visual changes. ENT: No sore throat. Cardiovascular: Denies chest pain. Respiratory: Denies shortness of breath. Gastrointestinal: No abdominal pain.  No nausea, no vomiting.  No diarrhea.   No constipation. Genitourinary: Negative for dysuria. Musculoskeletal: Negative for back pain. Skin: Negative for rash. Neurological: Negative for headaches, focal weakness  ____________________________________________   PHYSICAL EXAM:  VITAL SIGNS: ED Triage Vitals  Enc Vitals Group     BP 07/25/18 1404 (!) 139/97     Pulse Rate 07/25/18 1404 (!) 107     Resp 07/25/18 1404 16     Temp 07/25/18 1404 98.2 F (36.8 C)     Temp Source 07/25/18 1404 Oral     SpO2 07/25/18 1404 97 %     Weight 07/25/18 1402 250 lb (113.4 kg)     Height 07/25/18 1402 5\' 6"  (1.676 m)     Head Circumference --      Peak Flow --      Pain Score 07/25/18 1402 0     Pain Loc --      Pain Edu? --      Excl. in [place]? --     Constitutional: Alert and oriented. Well appearing and in no acute distress. Eyes: Conjunctivae are normal. PERRL. EOMI. Head: Atraumatic. Nose: No congestion/rhinnorhea. Mouth/Throat: Mucous membranes are moist.  Oropharynx non-erythematous. Neck: No stridor.  Cardiovascular: Normal rate, regular rhythm. Grossly normal heart sounds.  Good peripheral circulation. Respiratory: Normal respiratory effort.  No retractions. Lungs CTAB. Gastrointestinal: Soft and nontender. No distention. No abdominal bruits. No CVA tenderness. Musculoskeletal: No lower extremity tenderness nor edema. Neurologic:  Normal speech and language. No gross focal neurologic deficits are appreciated Skin:  Skin is warm, dry and intact. No rash noted. Psychiatric: Mood and affect are normal. Speech and behavior are normal.  ____________________________________________   LABS (all labs ordered are listed, but only abnormal results are displayed)  Labs Reviewed - No data to display ____________________________________________  EKG   ____________________________________________  RADIOLOGY  ED MD interpretation: Chest x-ray read by radiology reviewed by me is clear  Official radiology report(s): No  results found.  ____________________________________________   PROCEDURES  Procedure(s) performed (including Critical Care):  Procedures   ____________________________________________   INITIAL IMPRESSION / ASSESSMENT AND PLAN / ED COURSE  Patient does sound like she may have aspirated slightly.  Chest x-ray does not reveal any pathology.  I will give her some Augmentin just in case and albuterol inhaler as hers is expired.  She will return if she is worse.  This includes fever increased cough or shortness of breath.      ____________________________________________   FINAL CLINICAL IMPRESSION(S) / ED DIAGNOSES  Final diagnoses:  Cough     ED Discharge Orders    None       Note:  This document was prepared using Dragon voice recognition software and may include unintentional dictation errors.    Nena Polio, MD 07/25/18 1450

## 2019-03-02 ENCOUNTER — Other Ambulatory Visit (HOSPITAL_COMMUNITY)
Admission: RE | Admit: 2019-03-02 | Discharge: 2019-03-02 | Disposition: A | Discharge status: Home / Self Care | Payer: BC Managed Care – PPO | Source: Ambulatory Visit | Attending: Obstetrics and Gynecology | Admitting: Obstetrics and Gynecology

## 2019-03-02 ENCOUNTER — Encounter: Payer: Self-pay | Admitting: Obstetrics and Gynecology

## 2019-03-02 ENCOUNTER — Telehealth: Payer: Self-pay | Admitting: Obstetrics and Gynecology

## 2019-03-02 ENCOUNTER — Ambulatory Visit (INDEPENDENT_AMBULATORY_CARE_PROVIDER_SITE_OTHER): Payer: BC Managed Care – PPO | Admitting: Obstetrics and Gynecology

## 2019-03-02 ENCOUNTER — Other Ambulatory Visit: Payer: Self-pay

## 2019-03-02 VITALS — BP 122/78 | Ht 66.0 in | Wt 244.0 lb

## 2019-03-02 DIAGNOSIS — Z124 Encounter for screening for malignant neoplasm of cervix: Secondary | ICD-10-CM

## 2019-03-02 DIAGNOSIS — F419 Anxiety disorder, unspecified: Secondary | ICD-10-CM

## 2019-03-02 DIAGNOSIS — F329 Major depressive disorder, single episode, unspecified: Secondary | ICD-10-CM | POA: Diagnosis not present

## 2019-03-02 DIAGNOSIS — N840 Polyp of corpus uteri: Secondary | ICD-10-CM

## 2019-03-02 DIAGNOSIS — Z1331 Encounter for screening for depression: Secondary | ICD-10-CM

## 2019-03-02 DIAGNOSIS — N921 Excessive and frequent menstruation with irregular cycle: Secondary | ICD-10-CM

## 2019-03-02 DIAGNOSIS — F32A Depression, unspecified: Secondary | ICD-10-CM

## 2019-03-02 DIAGNOSIS — Z01419 Encounter for gynecological examination (general) (routine) without abnormal findings: Secondary | ICD-10-CM

## 2019-03-02 DIAGNOSIS — Z1339 Encounter for screening examination for other mental health and behavioral disorders: Secondary | ICD-10-CM

## 2019-03-02 MED ORDER — SERTRALINE HCL 50 MG PO TABS: 50.0000 mg | ORAL_TABLET | Freq: Every day | ORAL | 0 refills | Status: DC

## 2019-03-02 MED ORDER — LORAZEPAM 0.5 MG PO TABS: 0.5000 mg | ORAL_TABLET | Freq: Two times a day (BID) | ORAL | 0 refills | Status: DC | PRN

## 2019-03-02 MED ORDER — MEDROXYPROGESTERONE ACETATE 10 MG PO TABS: 10.0000 mg | ORAL_TABLET | Freq: Every day | ORAL | 0 refills | Status: DC

## 2019-03-02 NOTE — Telephone Encounter (Signed)
advise

## 2019-03-02 NOTE — Telephone Encounter (Signed)
Patient is calling due to prescriptions not at pharmacy for Zoloft and Lorazepam . Please advise

## 2019-03-02 NOTE — Progress Notes (Signed)
Gynecology Annual Exam  PCP: [facility] [place]  Chief Complaint  Patient presents with  . Annual Exam    History of Present Illness:  Ms. Valerie Massey is a 38 y.o. G0P0000 who LMP was Patient's last menstrual period was 12/31/2018., presents today for her annual examination.  Her menses have been normal up until August where she would have a period monthly, lasting 5-7 days, heavy with passage of clots.  She had no pain with periods prior to August. Since August she has had dull-brown to pink to red spotting 5 days out of 7.  She has also been having bleeding during and after intercourse.  She has also been having pain on her right side that feels like ovulation-like pain, but worse. She took a pregnancy test two weeks ago, which was negative.     She is sexually active.  She uses nothing for contraception.    Last Pap: 01/28/2018  Results were: no abnormalities /neg HPV DNA negative Hx of STDs: none  Last mammogram: neve had There is a FH of breast cancer in her mother. There is no a  Family of ovarian cancer in her mother. The patient does do self-breast exams.  Tobacco use: smokes 0.5 ppd  Alcohol use: social drinker Exercise: exercise at work  The patient wears seatbelts: yes.      She has been feeling depression and anxiety. She denies Si/Hi.  She has not been active outside of her house due to the pandemic.  She has a history of depression. She was hospitalized at the age of 14.  Though her PHQ-9 today is not significantly elevated, the patient feels she would benefit from some treatment.  Furthermore, she states that she gets sporadic panic attacks.  In the past she has taken lorazepam, which helped most of the time.  Past Medical History:  Diagnosis Date  . Anemia   . Depression   . History of hiatal hernia   . Migraines     Past Surgical History:  Procedure Laterality Date  . DILATATION & CURETTAGE/HYSTEROSCOPY WITH MYOSURE N/A 03/12/2018   Procedure: DILATATION & CURETTAGE/HYSTEROSCOPY WITH MYOSURE;  Surgeon: Will Bonnet, MD;  Location: [facility] ORS;  Service: Gynecology;  Laterality: N/A;  . DILATION AND CURETTAGE OF UTERUS    . WISDOM TOOTH EXTRACTION      Prior to Admission medications   Medication Sig Start Date End Date Taking? Authorizing Provider  acetaminophen (TYLENOL) 500 MG tablet Take 500-1,000 mg by mouth every 6 (six) hours as needed for mild pain or moderate pain.   Yes [provider]  omeprazole (PRILOSEC) 40 MG capsule Take 40 mg by mouth daily.    Yes [provider]  calcium carbonate (TUMS - DOSED IN MG ELEMENTAL CALCIUM) 500 MG chewable tablet Chew 2 tablets by mouth daily as needed for indigestion or heartburn.    [provider]  diphenhydrAMINE (BENADRYL) 25 MG tablet Take 2 tablets (50 mg total) by mouth every 6 (six) hours for 3 days. 04/30/18 05/03/18  Carrie Mew, MD  medroxyPROGESTERone (PROVERA) 10 MG tablet Take 4 tablets (40 mg total) by mouth daily for 4 days. 03/17/18 03/21/18  Rudene Re, MD  Multiple Vitamin (MULTIVITAMIN WITH MINERALS) TABS tablet Take 1 tablet by mouth once a week.    [provider]    Allergies  Allergen Reactions  . Bee Venom Hives and Swelling  . Other Nausea And Vomiting    scallops  . Tamiflu [Oseltamivir  Phosphate] Hives    Gynecologic History: Patient's last menstrual period was 12/31/2018.  Obstetric History: G0P0000  Social History   Socioeconomic History  . Marital status: Single    Spouse name: Not on file  . Number of children: Not on file  . Years of education: Not on file  . Highest education level: Not on file  Occupational History  . Occupation: Investment banker, operational  . Financial resource strain: Not on file  . Food insecurity    Worry: Not on file    Inability: Not on file  . Transportation needs    Medical: Not on file    Non-medical: Not on file  Tobacco Use  . Smoking status:  Current Every Day Smoker    Packs/day: 0.50    Types: Cigarettes    Start date: 05/06/1997  . Smokeless tobacco: Never Used  Substance and Sexual Activity  . Alcohol use: Yes    Comment: 1 q month  . Drug use: Never  . Sexual activity: Yes    Partners: Male    Birth control/protection: None  Lifestyle  . Physical activity    Days per week: 0 days    Minutes per session: Not on file  . Stress: Not on file  Relationships  . Social Herbalist on phone: Not on file    Gets together: Not on file    Attends religious service: Not on file    Active member of club or organization: Not on file    Attends meetings of clubs or organizations: Not on file    Relationship status: Not on file  . Intimate partner violence    Fear of current or ex partner: Not on file    Emotionally abused: Not on file    Physically abused: Not on file    Forced sexual activity: Not on file  Other Topics Concern  . Not on file  Social History Narrative  . Not on file    Family History  Problem Relation Age of Onset  . Breast cancer Mother 23  . Ovarian cancer Mother 6  . Endometrial cancer Maternal Grandmother 54  . Lung cancer Maternal Grandfather 60  . Lung cancer Paternal Grandfather 80    Review of Systems  Constitutional: Negative.   HENT: Negative.   Eyes: Negative.   Respiratory: Negative.   Cardiovascular: Negative.   Gastrointestinal: Negative.   Genitourinary: Negative.   Musculoskeletal: Negative.   Skin: Negative.   Neurological: Negative.   Psychiatric/Behavioral: Positive for depression. Negative for hallucinations, memory loss, substance abuse and suicidal ideas. The patient is nervous/anxious. The patient does not have insomnia.      Physical Exam BP 122/78   Ht 5\' 6"  (1.676 m)   Wt 244 lb (110.7 kg)   LMP 12/31/2018   BMI 39.38 kg/m    Physical Exam Constitutional:      General: She is not in acute distress.    Appearance: Normal appearance. She is  well-developed.  Genitourinary:     Pelvic exam was performed with patient in the lithotomy position.     Vulva, urethra, bladder and uterus normal.     No inguinal adenopathy present in the right or left side.    No signs of injury in the vagina.     No vaginal discharge, erythema, tenderness or bleeding.     No cervical motion tenderness, discharge, lesion or polyp.     Uterus is mobile.     Uterus  is not enlarged or tender.     No uterine mass detected.    Uterus is anteverted.     No right or left adnexal mass present.     Right adnexa not tender or full.     Left adnexa not tender or full.  HENT:     Head: Normocephalic and atraumatic.  Eyes:     General: No scleral icterus.    Conjunctiva/sclera: Conjunctivae normal.  Neck:     Musculoskeletal: Normal range of motion and neck supple.     Thyroid: No thyromegaly.  Cardiovascular:     Rate and Rhythm: Normal rate and regular rhythm.     Heart sounds: No murmur. No friction rub. No gallop.   Pulmonary:     Effort: Pulmonary effort is normal. No respiratory distress.     Breath sounds: Normal breath sounds. No wheezing or rales.  Chest:     Breasts:        Right: No inverted nipple, mass, nipple discharge, skin change or tenderness.        Left: No inverted nipple, mass, nipple discharge, skin change or tenderness.  Abdominal:     General: Bowel sounds are normal. There is no distension.     Palpations: Abdomen is soft. There is no mass.     Tenderness: There is no abdominal tenderness. There is no guarding or rebound.  Musculoskeletal: Normal range of motion.        General: No swelling or tenderness.  Lymphadenopathy:     Cervical: No cervical adenopathy.     Lower Body: No right inguinal adenopathy. No left inguinal adenopathy.  Neurological:     General: No focal deficit present.     Mental Status: She is alert and oriented to person, place, and time.     Cranial Nerves: No cranial nerve deficit.  Skin:     General: Skin is warm and dry.     Findings: No erythema or rash.  Psychiatric:        Mood and Affect: Mood normal.        Behavior: Behavior normal.        Judgment: Judgment normal.  Vitals signs reviewed.     Female chaperone present for pelvic and breast  portions of the physical exam  Results: AUDIT Questionnaire (screen for alcoholism): 1 PHQ-9: 6   Assessment: 38 y.o. G0P0000 female here for routine annual gynecologic examination.  Plan: Problem List Items Addressed This Visit      Genitourinary   Endometrial polyp   Relevant Orders   US PELVIS TRANSVAGINAL NON-OB (TV ONLY)     Other   Menorrhagia with irregular cycle   Relevant Medications   medroxyPROGESTERone (PROVERA) 10 MG tablet   Other Relevant Orders   US PELVIS TRANSVAGINAL NON-OB (TV ONLY)    Other Visit Diagnoses    Women's annual routine gynecological examination    -  Primary   Relevant Medications   sertraline (ZOLOFT) 50 MG tablet   LORazepam (ATIVAN) 0.5 MG tablet   Other Relevant Orders   US PELVIS TRANSVAGINAL NON-OB (TV ONLY)   Cytology - PAP   Screening for depression       Screening for alcoholism       Pap smear for cervical cancer screening       Relevant Orders   Cytology - PAP   Anxiety and depression       Relevant Medications   sertraline (ZOLOFT) 50 MG tablet   LORazepam (ATIVAN)  0.5 MG tablet      Screening: -- Blood pressure screen normal -- Colonoscopy - not due -- Mammogram - not due -- Weight screening: obese: discussed management options, including lifestyle, dietary, and exercise. -- Depression screening negative (PHQ-9) -- Nutrition: normal -- cholesterol screening: not due for screening -- osteoporosis screening: not due -- tobacco screening: using: discussed quitting using the 5 A's -- alcohol screening: AUDIT questionnaire indicates low-risk usage. -- family history of breast cancer screening: done. not at high risk. -- no evidence of domestic violence or  intimate partner violence. -- STD screening: gonorrhea/chlamydia NAAT not collected per patient request. -- pap smear collected per ASCCP guidelines  For menorrhagia: We will obtain pelvic ultrasound.  We will also give her Provera to try to stabilize her uterine lining in the meantime.  For depression and anxiety: We will start her on Zoloft 50 mg.  She has used Ativan in the past for panic attacks.  We will give her a small amount that should last her a while.  Discussed that this would not be a good long-term strategy.  We will have to consider other form of management in the long run.   Prentice Docker, MD 03/02/2019 12:11 PM

## 2019-03-02 NOTE — Telephone Encounter (Signed)
I'll put them in after clinic today.

## 2019-03-03 ENCOUNTER — Encounter: Payer: Self-pay | Admitting: Obstetrics and Gynecology

## 2019-03-08 ENCOUNTER — Encounter: Payer: Self-pay | Admitting: Obstetrics and Gynecology

## 2019-03-09 LAB — CYTOLOGY - PAP
Comment: NEGATIVE
Diagnosis: NEGATIVE
High risk HPV: NEGATIVE

## 2019-03-11 ENCOUNTER — Telehealth: Payer: Self-pay

## 2019-03-11 NOTE — Telephone Encounter (Signed)
Pt called for pap results, shes aware they are normal

## 2019-03-17 ENCOUNTER — Telehealth: Payer: Self-pay

## 2019-03-17 ENCOUNTER — Emergency Department: Payer: BC Managed Care – PPO

## 2019-03-17 ENCOUNTER — Other Ambulatory Visit: Payer: BC Managed Care – PPO

## 2019-03-17 ENCOUNTER — Encounter: Payer: Self-pay | Admitting: Emergency Medicine

## 2019-03-17 ENCOUNTER — Emergency Department
Admission: EM | Admit: 2019-03-17 | Discharge: 2019-03-17 | Disposition: A | Discharge status: Home / Self Care | Payer: BC Managed Care – PPO | Attending: Emergency Medicine | Admitting: Emergency Medicine

## 2019-03-17 ENCOUNTER — Other Ambulatory Visit: Payer: Self-pay

## 2019-03-17 DIAGNOSIS — N8500 Endometrial hyperplasia, unspecified: Secondary | ICD-10-CM | POA: Diagnosis not present

## 2019-03-17 DIAGNOSIS — R1030 Lower abdominal pain, unspecified: Secondary | ICD-10-CM | POA: Insufficient documentation

## 2019-03-17 DIAGNOSIS — R42 Dizziness and giddiness: Secondary | ICD-10-CM | POA: Diagnosis not present

## 2019-03-17 DIAGNOSIS — F17210 Nicotine dependence, cigarettes, uncomplicated: Secondary | ICD-10-CM | POA: Insufficient documentation

## 2019-03-17 DIAGNOSIS — R9389 Abnormal findings on diagnostic imaging of other specified body structures: Secondary | ICD-10-CM | POA: Diagnosis not present

## 2019-03-17 DIAGNOSIS — Z79899 Other long term (current) drug therapy: Secondary | ICD-10-CM | POA: Diagnosis not present

## 2019-03-17 DIAGNOSIS — N939 Abnormal uterine and vaginal bleeding, unspecified: Secondary | ICD-10-CM | POA: Diagnosis not present

## 2019-03-17 LAB — URINALYSIS, COMPLETE (UACMP) WITH MICROSCOPIC
Bacteria, UA: NONE SEEN
RBC / HPF: >50 RBC/hpf — ABNORMAL HIGH (ref 0–5)
Specific Gravity, Urine: 1.017 (ref 1.005–1.030)

## 2019-03-17 LAB — CBC WITH DIFFERENTIAL/PLATELET
Abs Immature Granulocytes: 0.03 10*3/uL (ref 0.00–0.07)
Basophils Absolute: 0 10*3/uL (ref 0.0–0.1)
Basophils Relative: 0 %
Eosinophils Absolute: 0.1 10*3/uL (ref 0.0–0.5)
Eosinophils Relative: 1 %
HCT: 37.2 % (ref 36.0–46.0)
Hemoglobin: 12.6 g/dL (ref 12.0–15.0)
Immature Granulocytes: 0 %
Lymphocytes Relative: 23 %
Lymphs Abs: 2.1 10*3/uL (ref 0.7–4.0)
MCH: 27.5 pg (ref 26.0–34.0)
MCHC: 33.9 g/dL (ref 30.0–36.0)
MCV: 81.2 fL (ref 80.0–100.0)
Monocytes Absolute: 0.5 10*3/uL (ref 0.1–1.0)
Monocytes Relative: 5 %
Neutro Abs: 6.6 10*3/uL (ref 1.7–7.7)
Neutrophils Relative %: 71 %
Platelets: 318 10*3/uL (ref 150–400)
RBC: 4.58 MIL/uL (ref 3.87–5.11)
RDW: 14.4 % (ref 11.5–15.5)
WBC: 9.4 10*3/uL (ref 4.0–10.5)
nRBC: 0 % (ref 0.0–0.2)

## 2019-03-17 LAB — COMPREHENSIVE METABOLIC PANEL
ALT: 28 U/L (ref 0–44)
AST: 25 U/L (ref 15–41)
Albumin: 4.3 g/dL (ref 3.5–5.0)
Alkaline Phosphatase: 68 U/L (ref 38–126)
Anion gap: 10 (ref 5–15)
BUN: 12 mg/dL (ref 6–20)
CO2: 24 mmol/L (ref 22–32)
Calcium: 9.2 mg/dL (ref 8.9–10.3)
Chloride: 106 mmol/L (ref 98–111)
Creatinine, Ser: 0.8 mg/dL (ref 0.44–1.00)
GFR calc Af Amer: >60 mL/min (ref >60)
GFR calc non Af Amer: >60 mL/min (ref >60)
Glucose, Bld: 119 mg/dL — ABNORMAL HIGH (ref 70–99)
Potassium: 3.8 mmol/L (ref 3.5–5.1)
Sodium: 140 mmol/L (ref 135–145)
Total Bilirubin: 0.5 mg/dL (ref 0.3–1.2)
Total Protein: 8.1 g/dL (ref 6.5–8.1)

## 2019-03-17 LAB — WET PREP, GENITAL
Clue Cells Wet Prep HPF POC: NONE SEEN
Sperm: NONE SEEN
Trich, Wet Prep: NONE SEEN
Yeast Wet Prep HPF POC: NONE SEEN

## 2019-03-17 LAB — PREGNANCY, URINE: Preg Test, Ur: NEGATIVE

## 2019-03-17 NOTE — ED Triage Notes (Signed)
Started on progestin in October and completed 10 day coarse last week.  Arrives today with c/o vaginal bleeding.  States since last night, bleeding through 1-2 pads per hour.  Patient is AAOx3.  Skin warm and dry. NAD

## 2019-03-17 NOTE — Telephone Encounter (Signed)
Pt called triage today stating she was put on medroxyprogesterone to try and start a period. She has finished the medication and has now started bleeding very heavily to the point that she is filling a pad every hour. I have called and advised pt to go to the ER since SDJ is not in the office today or tomorrow. Pt agreed and will be going to the hospital. I have advised her I will be sending this information to SDJ so that he can see this when he get back into the office. Thank you

## 2019-03-17 NOTE — ED Provider Notes (Signed)
[facility] Emergency Department Provider Note   ____________________________________________   First MD Initiated Contact with Patient 03/17/19 1125     (approximate)  I have reviewed the triage vital signs and the nursing notes.   HISTORY  Chief Complaint Vaginal Bleeding    HPI Valerie Massey is a 38 y.o. female with past medical history of menorrhagia and irregular periods presents to the ED complaining of vaginal bleeding.  Patient reports that she has frequently dealt with irregular and heavy periods in the past but about 1 month ago she stopped having any bleeding.  She was evaluated by her OB/GYN at that time, who started her on progesterone and scheduled an ultrasound.  She reports completing the progesterone a few days ago and has had onset of bleeding since then.  She describes heavier bleeding than she is used to and has been going through a pad every 1-2 hours.  She complains of mild lower abdominal cramping but has not had any severe pain, vomiting, dysuria, hematuria, or changes in bowel movements.        Past Medical History:  Diagnosis Date  . Anemia   . BRCA negative 2015   MyRisk neg  . Depression   . Family history of breast cancer 2015   IBIS=14.1%  . Family history of ovarian cancer   . History of hiatal hernia   . Migraines     Patient Active Problem List   Diagnosis Date Noted  . Endometrial polyp 02/10/2018  . Menorrhagia with irregular cycle 01/28/2018    Past Surgical History:  Procedure Laterality Date  . DILATATION & CURETTAGE/HYSTEROSCOPY WITH MYOSURE N/A 03/12/2018   Procedure: DILATATION & CURETTAGE/HYSTEROSCOPY WITH MYOSURE;  Surgeon: Will Bonnet, MD;  Location: [facility] ORS;  Service: Gynecology;  Laterality: N/A;  . DILATION AND CURETTAGE OF UTERUS    . WISDOM TOOTH EXTRACTION      Prior to Admission medications   Medication Sig Start Date End Date Taking? Authorizing Provider  acetaminophen  (TYLENOL) 500 MG tablet Take 500-1,000 mg by mouth every 6 (six) hours as needed for mild pain or moderate pain.    [provider]  albuterol (PROVENTIL HFA) 108 (90 Base) MCG/ACT inhaler Inhale 2 puffs into the lungs every 4 (four) hours as needed for wheezing or shortness of breath. Patient not taking: Reported on 03/02/2019 04/30/18   Carrie Mew, MD  benzonatate (TESSALON PERLES) 100 MG capsule Take 1 capsule (100 mg total) by mouth 3 (three) times daily as needed for cough. Patient not taking: Reported on 03/02/2019 04/30/18 04/30/19  Gregor Hams, MD  calcium carbonate (TUMS - DOSED IN MG ELEMENTAL CALCIUM) 500 MG chewable tablet Chew 2 tablets by mouth daily as needed for indigestion or heartburn.    [provider]  diphenhydrAMINE (BENADRYL) 25 MG tablet Take 2 tablets (50 mg total) by mouth every 6 (six) hours for 3 days. 04/30/18 05/03/18  Carrie Mew, MD  HYDROcodone-acetaminophen (NORCO/VICODIN) 5-325 MG tablet Take 1 tablet by mouth every 8 (eight) hours as needed (breakthrough pain). Patient not taking: Reported on 03/02/2019 03/12/18   Will Bonnet, MD  ibuprofen (ADVIL,MOTRIN) 800 MG tablet Take 1 tablet (800 mg total) by mouth every 8 (eight) hours as needed. Patient not taking: Reported on 03/02/2019 03/12/18   Will Bonnet, MD  LORazepam (ATIVAN) 0.5 MG tablet Take 1 tablet (0.5 mg total) by mouth every 12 (twelve) hours as needed for anxiety. 03/02/19   Will Bonnet,  MD  medroxyPROGESTERone (PROVERA) 10 MG tablet Take 1 tablet (10 mg total) by mouth daily for 10 days. 03/02/19 03/12/19  Will Bonnet, MD  Multiple Vitamin (MULTIVITAMIN WITH MINERALS) TABS tablet Take 1 tablet by mouth once a week.    [provider]  omeprazole (PRILOSEC) 40 MG capsule Take 40 mg by mouth daily.     [provider]  sertraline (ZOLOFT) 50 MG tablet Take 1 tablet (50 mg total) by mouth daily. 03/02/19   Will Bonnet, MD     Allergies Bee venom, Other, and Tamiflu [oseltamivir phosphate]  Family History  Problem Relation Age of Onset  . Breast cancer Mother 67  . Ovarian cancer Mother 58  . Endometrial cancer Maternal Grandmother 77  . Lung cancer Maternal Grandfather 60  . Lung cancer Paternal Grandfather 61    Social History Social History   Tobacco Use  . Smoking status: Current Every Day Smoker    Packs/day: 0.50    Types: Cigarettes    Start date: 05/06/1997  . Smokeless tobacco: Never Used  Substance Use Topics  . Alcohol use: Yes    Comment: 1 q month  . Drug use: Never    Review of Systems  Constitutional: No fever/chills Eyes: No visual changes. ENT: No sore throat. Cardiovascular: Denies chest pain. Respiratory: Denies shortness of breath. Gastrointestinal: Positive for abdominal pain.  No nausea, no vomiting.  No diarrhea.  No constipation. Genitourinary: Negative for dysuria.  Positive for vaginal bleeding. Musculoskeletal: Negative for back pain. Skin: Negative for rash. Neurological: Negative for headaches, focal weakness or numbness.  ____________________________________________   PHYSICAL EXAM:  VITAL SIGNS: ED Triage Vitals  Enc Vitals Group     BP 03/17/19 1006 (!) 142/78     Pulse Rate 03/17/19 1006 86     Resp 03/17/19 1006 16     Temp 03/17/19 1006 98.9 F (37.2 C)     Temp Source 03/17/19 1006 Oral     SpO2 03/17/19 1006 96 %     Weight 03/17/19 1002 244 lb 0.8 oz (110.7 kg)     Height --      Head Circumference --      Peak Flow --      Pain Score 03/17/19 1002 0     Pain Loc --      Pain Edu? --      Excl. in [place]? --     Constitutional: Alert and oriented. Eyes: Conjunctivae are normal. Head: Atraumatic. Nose: No congestion/rhinnorhea. Mouth/Throat: Mucous membranes are moist. Neck: Normal ROM Cardiovascular: Normal rate, regular rhythm. Grossly normal heart sounds. Respiratory: Normal respiratory effort.  No retractions. Lungs CTAB.  Gastrointestinal: Soft and nontender. No distention. Genitourinary: Moderate amount of blood noted in the vaginal vault with no cervical motion or adnexal tenderness. Musculoskeletal: No lower extremity tenderness nor edema. Neurologic:  Normal speech and language. No gross focal neurologic deficits are appreciated. Skin:  Skin is warm, dry and intact. No rash noted. Psychiatric: Mood and affect are normal. Speech and behavior are normal.  ____________________________________________   LABS (all labs ordered are listed, but only abnormal results are displayed)  Labs Reviewed  WET PREP, GENITAL - Abnormal; Notable for the following components:      Result Value   WBC, Wet Prep HPF POC RARE (*)    All other components within normal limits  COMPREHENSIVE METABOLIC PANEL - Abnormal; Notable for the following components:   Glucose, Bld 119 (*)    All other  components within normal limits  URINALYSIS, COMPLETE (UACMP) WITH MICROSCOPIC - Abnormal; Notable for the following components:   Color, Urine RED (*)    APPearance CLOUDY (*)    Glucose, UA   (*)    Value: TEST NOT REPORTED DUE TO COLOR INTERFERENCE OF URINE PIGMENT   Hgb urine dipstick   (*)    Value: TEST NOT REPORTED DUE TO COLOR INTERFERENCE OF URINE PIGMENT   Bilirubin Urine   (*)    Value: TEST NOT REPORTED DUE TO COLOR INTERFERENCE OF URINE PIGMENT   Ketones, ur   (*)    Value: TEST NOT REPORTED DUE TO COLOR INTERFERENCE OF URINE PIGMENT   Protein, ur   (*)    Value: TEST NOT REPORTED DUE TO COLOR INTERFERENCE OF URINE PIGMENT   Nitrite   (*)    Value: TEST NOT REPORTED DUE TO COLOR INTERFERENCE OF URINE PIGMENT   Leukocytes,Ua   (*)    Value: TEST NOT REPORTED DUE TO COLOR INTERFERENCE OF URINE PIGMENT   RBC / HPF >50 (*)    All other components within normal limits  CBC WITH DIFFERENTIAL/PLATELET  PREGNANCY, URINE     PROCEDURES  Procedure(s) performed (including Critical Care):  Procedures    ____________________________________________   INITIAL IMPRESSION / ASSESSMENT AND PLAN / ED COURSE       38 year old female presents to the ED with a couple days of vaginal bleeding, going through 1-2 pads every hour with some lower abdominal cramping.  She has no focal tenderness on abdominal exam, pelvic exam shows moderate amount of blood originating from her cervix with no cervical motion or next tenderness.  Her H&H is stable and she denies any symptoms of anemia.  Will obtain ultrasound and plan for close OB follow-up assuming pregnancy testing is negative.  Ultrasound shows thickened endometrium similar to patient's prior ultrasound.  Counseled patient to follow-up with her OB/GYN for possible biopsy and to address her significant vaginal bleeding.  Counseled patient to return to the ED for new or worsening symptoms, patient agrees with plan.      ____________________________________________   FINAL CLINICAL IMPRESSION(S) / ED DIAGNOSES  Final diagnoses:  Vagina bleeding  Thickened endometrium     ED Discharge Orders    None       Note:  This document was prepared using Dragon voice recognition software and may include unintentional dictation errors.   Blake Divine, MD 03/17/19 1447

## 2019-03-17 NOTE — ED Notes (Addendum)
Pt presents with c/o vaginal bleeding that started at midnight last night, denies cramping at this time, reports lightheadedness.  Reports LMP in August. Denies chance of pregnancy Pt in NAD at this time

## 2019-03-19 ENCOUNTER — Other Ambulatory Visit: Payer: Self-pay | Admitting: Obstetrics and Gynecology

## 2019-03-19 DIAGNOSIS — N921 Excessive and frequent menstruation with irregular cycle: Secondary | ICD-10-CM

## 2019-03-19 MED ORDER — MEDROXYPROGESTERONE ACETATE 10 MG PO TABS: 10.0000 mg | ORAL_TABLET | Freq: Three times a day (TID) | ORAL | 0 refills | Status: DC

## 2019-03-19 NOTE — Telephone Encounter (Signed)
Pt aware.

## 2019-03-19 NOTE — Telephone Encounter (Signed)
Please call and let her know that I sent in more Provera. She should take 1 tablet 3 times each day until she sees me again.  That should help a lot. Let me know, if it doesn't.

## 2019-03-19 NOTE — Telephone Encounter (Signed)
Please advise 

## 2019-03-24 ENCOUNTER — Other Ambulatory Visit (HOSPITAL_COMMUNITY)
Admission: RE | Admit: 2019-03-24 | Discharge: 2019-03-24 | Disposition: A | Discharge status: Home / Self Care | Payer: BC Managed Care – PPO | Source: Ambulatory Visit | Attending: Obstetrics and Gynecology | Admitting: Obstetrics and Gynecology

## 2019-03-24 ENCOUNTER — Other Ambulatory Visit: Payer: Self-pay

## 2019-03-24 ENCOUNTER — Ambulatory Visit (INDEPENDENT_AMBULATORY_CARE_PROVIDER_SITE_OTHER): Payer: BC Managed Care – PPO

## 2019-03-24 ENCOUNTER — Ambulatory Visit (INDEPENDENT_AMBULATORY_CARE_PROVIDER_SITE_OTHER): Payer: BC Managed Care – PPO | Admitting: Obstetrics and Gynecology

## 2019-03-24 ENCOUNTER — Encounter: Payer: Self-pay | Admitting: Obstetrics and Gynecology

## 2019-03-24 VITALS — Ht 66.0 in | Wt 237.0 lb

## 2019-03-24 DIAGNOSIS — F419 Anxiety disorder, unspecified: Secondary | ICD-10-CM

## 2019-03-24 DIAGNOSIS — N921 Excessive and frequent menstruation with irregular cycle: Secondary | ICD-10-CM

## 2019-03-24 DIAGNOSIS — F329 Major depressive disorder, single episode, unspecified: Secondary | ICD-10-CM | POA: Diagnosis not present

## 2019-03-24 DIAGNOSIS — N840 Polyp of corpus uteri: Secondary | ICD-10-CM

## 2019-03-24 DIAGNOSIS — F32A Depression, unspecified: Secondary | ICD-10-CM

## 2019-03-24 DIAGNOSIS — Z01419 Encounter for gynecological examination (general) (routine) without abnormal findings: Secondary | ICD-10-CM

## 2019-03-24 MED ORDER — SERTRALINE HCL 50 MG PO TABS: 50.0000 mg | ORAL_TABLET | Freq: Every day | ORAL | 2 refills | Status: DC

## 2019-03-24 NOTE — Progress Notes (Signed)
Gynecology Ultrasound Follow Up  Chief Complaint:  Chief Complaint  Patient presents with  . Follow-up    U/S follow up   menorrhagia with irregular cycle  History of Present Illness: Patient is a 38 y.o. female who presents today for ultrasound evaluation of the above .  Ultrasound demonstrates the following findings Adnexa: no masses seen  Uterus: anteverted with endometrial stripe  8.9 mm (13 mm 1 week ago) Additional: increased vascularity on u/s 1 week ago.   Her bleeding is much improved on the medication.   She is doing well on the Zoloft and would like a refill. Denies SI/HI.   Past Medical History:  Diagnosis Date  . Anemia   . BRCA negative 2015   MyRisk neg  . Depression   . Family history of breast cancer 2015   IBIS=14.1%  . Family history of ovarian cancer   . History of hiatal hernia   . Migraines     Past Surgical History:  Procedure Laterality Date  . DILATATION & CURETTAGE/HYSTEROSCOPY WITH MYOSURE N/A 03/12/2018   Procedure: DILATATION & CURETTAGE/HYSTEROSCOPY WITH MYOSURE;  Surgeon: Will Bonnet, MD;  Location: [facility] ORS;  Service: Gynecology;  Laterality: N/A;  . DILATION AND CURETTAGE OF UTERUS    . WISDOM TOOTH EXTRACTION      Family History  Problem Relation Age of Onset  . Breast cancer Mother 45  . Ovarian cancer Mother 16  . Endometrial cancer Maternal Grandmother 69  . Lung cancer Maternal Grandfather 60  . Lung cancer Paternal Grandfather 38    Social History   Socioeconomic History  . Marital status: Single    Spouse name: Not on file  . Number of children: Not on file  . Years of education: Not on file  . Highest education level: Not on file  Occupational History  . Occupation: Investment banker, operational  . Financial resource strain: Not on file  . Food insecurity    Worry: Not on file    Inability: Not on file  . Transportation needs    Medical: Not on file    Non-medical: Not on file  Tobacco Use  . Smoking  status: Current Every Day Smoker    Packs/day: 0.50    Types: Cigarettes    Start date: 05/06/1997  . Smokeless tobacco: Never Used  Substance and Sexual Activity  . Alcohol use: Yes    Comment: 1 q month  . Drug use: Never  . Sexual activity: Yes    Partners: Male    Birth control/protection: None  Lifestyle  . Physical activity    Days per week: 0 days    Minutes per session: Not on file  . Stress: Not on file  Relationships  . Social Herbalist on phone: Not on file    Gets together: Not on file    Attends religious service: Not on file    Active member of club or organization: Not on file    Attends meetings of clubs or organizations: Not on file    Relationship status: Not on file  . Intimate partner violence    Fear of current or ex partner: Not on file    Emotionally abused: Not on file    Physically abused: Not on file    Forced sexual activity: Not on file  Other Topics Concern  . Not on file  Social History Narrative  . Not on file    Allergies  Allergen Reactions  .  Bee Venom Hives and Swelling  . Other Nausea And Vomiting    scallops  . Tamiflu [Oseltamivir Phosphate] Hives    Prior to Admission medications   Medication Sig Start Date End Date Taking? Authorizing Provider  acetaminophen (TYLENOL) 500 MG tablet Take 500-1,000 mg by mouth every 6 (six) hours as needed for mild pain or moderate pain.   Yes [provider]  calcium carbonate (TUMS - DOSED IN MG ELEMENTAL CALCIUM) 500 MG chewable tablet Chew 2 tablets by mouth daily as needed for indigestion or heartburn.   Yes [provider]  LORazepam (ATIVAN) 0.5 MG tablet Take 1 tablet (0.5 mg total) by mouth every 12 (twelve) hours as needed for anxiety. 03/02/19  Yes Will Bonnet, MD  medroxyPROGESTERone (PROVERA) 10 MG tablet Take 1 tablet (10 mg total) by mouth 3 (three) times daily. 03/19/19  Yes Will Bonnet, MD  Multiple Vitamin (MULTIVITAMIN WITH MINERALS)  TABS tablet Take 1 tablet by mouth once a week.   Yes [provider]  omeprazole (PRILOSEC) 40 MG capsule Take 40 mg by mouth daily.    Yes [provider]  sertraline (ZOLOFT) 50 MG tablet Take 1 tablet (50 mg total) by mouth daily. 03/02/19  Yes Will Bonnet, MD  albuterol (PROVENTIL HFA) 108 (90 Base) MCG/ACT inhaler Inhale 2 puffs into the lungs every 4 (four) hours as needed for wheezing or shortness of breath. Patient not taking: Reported on 03/02/2019 04/30/18   Carrie Mew, MD  benzonatate (TESSALON PERLES) 100 MG capsule Take 1 capsule (100 mg total) by mouth 3 (three) times daily as needed for cough. Patient not taking: Reported on 03/02/2019 04/30/18 04/30/19  Gregor Hams, MD  diphenhydrAMINE (BENADRYL) 25 MG tablet Take 2 tablets (50 mg total) by mouth every 6 (six) hours for 3 days. 04/30/18 05/03/18  Carrie Mew, MD  HYDROcodone-acetaminophen (NORCO/VICODIN) 5-325 MG tablet Take 1 tablet by mouth every 8 (eight) hours as needed (breakthrough pain). Patient not taking: Reported on 03/02/2019 03/12/18   Will Bonnet, MD  ibuprofen (ADVIL,MOTRIN) 800 MG tablet Take 1 tablet (800 mg total) by mouth every 8 (eight) hours as needed. Patient not taking: Reported on 03/02/2019 03/12/18   Will Bonnet, MD    Physical Exam Ht '5\' 6"'$  (1.676 m)   Wt 237 lb (107.5 kg)   LMP 03/12/2019 (Exact Date)   BMI 38.25 kg/m    General: NAD HEENT: normocephalic, anicteric Pulmonary: No increased work of breathing Extremities: no edema, erythema, or tenderness Neurologic: Grossly intact, normal gait Psychiatric: mood appropriate, affect full  Endometrial Biopsy After discussion with the patient regarding her abnormal uterine bleeding I recommended that she proceed with an endometrial biopsy for further diagnosis. The risks, benefits, alternatives, and indications for an endometrial biopsy were discussed with the patient in detail. She understood  the risks including infection, bleeding, cervical laceration and uterine perforation.  Verbal consent was obtained.   PROCEDURE NOTE:  Pipelle endometrial biopsy was performed using aseptic technique with iodine preparation.  The uterus was sounded to a length of 8 cm.  Adequate sampling was obtained with minimal blood loss.  The patient tolerated the procedure well.  Disposition will be pending pathology.   Imaging Results US Pelvis Transvaginal Non-ob (tv Only)  Result Date: 03/24/2019 Patient Name: Valerie Massey DOB: 1980-08-14 MRN: [id number] ULTRASOUND REPORT Location: [facility] Date of Service: 03/24/2019 Indications:Abnormal Uterine Bleeding Findings: The uterus is anteverted and measures 7.4 x 5.0 x 4.2 cm.  Echo texture is homogenous without evidence of focal masses. The Endometrium measures 8.9 mm. Right Ovary measures 2.8 x 2.7 x 2.4 cm. It is normal in appearance. Left Ovary measures 3.5 x 2.5 x 2.2 cm. It is normal in appearance. Survey of the adnexa demonstrates no adnexal masses. There is no free fluid in the cul de sac. Impression: 1. Norma pelvic ultrasound. Gweneth Dimitri, RT The ultrasound images and findings were reviewed by me and I agree with the above report. Prentice Docker, MD, [facility], [facility] 03/24/2019 3:44 PM       Assessment: 38 y.o. G0P0000  1. Menorrhagia with irregular cycle   2. Anxiety and depression      Plan: Problem List Items Addressed This Visit      Other   Menorrhagia with irregular cycle - Primary   Relevant Orders   Surgical pathology    Other Visit Diagnoses    Anxiety and depression       Relevant Medications   sertraline (ZOLOFT) 50 MG tablet     Endometrial biopsy performed today based on ultrasound results.  Discussed management of issue with no treatment (clinical observation), medication treatment such as something like an IUD, surgical treatment with ablation or hysterectomy.  The patient  believes she would like a hysterectomy.  We did do a hysteroscopy with dilation and curettage and polypectomy 1 year ago.  We will base a final decision on the biopsy results. Continue provera for now.   Anxiety depression: Refill on Zoloft that she is doing well on that at this time.  We will follow-up in 2 months.  15 minutes spent in face to face discussion with > 50% spent in counseling,management, and coordination of care of her menorrhagia with irregular cycle and anxiety and depression.   Prentice Docker, MD, [facility], [facility] 03/24/2019 4:20 PM

## 2019-03-26 LAB — SURGICAL PATHOLOGY

## 2019-03-29 ENCOUNTER — Telehealth: Payer: Self-pay | Admitting: Obstetrics and Gynecology

## 2019-03-29 NOTE — Telephone Encounter (Signed)
Left generic VM 

## 2019-04-05 ENCOUNTER — Telehealth: Payer: Self-pay | Admitting: Obstetrics and Gynecology

## 2019-04-05 NOTE — Telephone Encounter (Signed)
Discussed finding of endometrial polyp on biopsy. She has previous biopsies showing an endometrial polyp. However, last November, she underwent a hysteroscopy, D&C for a polyp on biopsy and no obvious polyp was visualized. The final pathology did reveal an endometrial polyp.   She was offered an SIS for further characterization.  She at this point believes she wants to just move forward with a hysterectomy. She does not want to make a final decision at this time. We will discuss again in January of next year.

## 2019-05-06 ENCOUNTER — Other Ambulatory Visit: Payer: Self-pay

## 2019-05-06 DIAGNOSIS — F329 Major depressive disorder, single episode, unspecified: Secondary | ICD-10-CM

## 2019-05-06 DIAGNOSIS — N921 Excessive and frequent menstruation with irregular cycle: Secondary | ICD-10-CM

## 2019-05-06 DIAGNOSIS — Z01419 Encounter for gynecological examination (general) (routine) without abnormal findings: Secondary | ICD-10-CM

## 2019-05-06 DIAGNOSIS — F419 Anxiety disorder, unspecified: Secondary | ICD-10-CM

## 2019-05-10 MED ORDER — LORAZEPAM 0.5 MG PO TABS: 0.5000 mg | ORAL_TABLET | Freq: Two times a day (BID) | ORAL | 0 refills | Status: DC | PRN

## 2019-05-10 MED ORDER — MEDROXYPROGESTERONE ACETATE 10 MG PO TABS: 10.0000 mg | ORAL_TABLET | Freq: Three times a day (TID) | ORAL | 0 refills | Status: DC

## 2019-05-10 NOTE — Telephone Encounter (Signed)
Patient reports she ran out of progesterone a few days ago. Pharmacy has sent refill request that hasn't been responded to. Patient clarifying if SDJ wants her to continue progesterone. She doesn't want to have issues with bleeding again.

## 2019-05-12 NOTE — Telephone Encounter (Signed)
Patient ready to schedule hysterectomy. [id number]

## 2019-05-16 NOTE — Telephone Encounter (Signed)
I have sent in the request for the hysterectomy. Will try to get the surgery scheduled prior to 1/25 due to the OR closing down to elective procedures. If we can't get the surgery scheduled by then, will have to delay the surgery until the OR opens back up.  Please call her and let her know.

## 2019-05-20 NOTE — Telephone Encounter (Signed)
Patient aware. She has apt on Monday for f/u and will discuss all options then.

## 2019-05-21 ENCOUNTER — Telehealth: Payer: Self-pay | Admitting: Obstetrics and Gynecology

## 2019-05-21 NOTE — Telephone Encounter (Signed)
-----   Message from Will Bonnet, MD sent at 05/16/2019  7:05 PM EST ----- Regarding: Schedule surgery Surgery Booking Request Patient Full Name:  Valerie Massey  MRN: [id number]  DOB: [birth year]  Surgeon: Prentice Docker, MD  Requested Surgery Date and Time: as soon as we can schedule Primary Diagnosis AND Code: Menorrhagia with irregular cycle (N92.1) Secondary Diagnosis and Code:  Surgical Procedure: Total laparoscopic hysterectomy, bilateral salpingectomy, cystoscopy L&D Notification: No Admission Status: same day surgery Length of Surgery: 2 hours Special Case Needs: No H&P: Yes Phone Interview???:  Yes Interpreter: No Language:  Medical Clearance:  No Special Scheduling Instructions: hopefully before OR lockdown Any known health/anesthesia issues, diabetes, sleep apnea, latex allergy, defibrillator/pacemaker?: No Acuity: P3   (P1 highest, P2 delay may cause harm, P3 low, elective gyn, P4 lowest)

## 2019-05-21 NOTE — Telephone Encounter (Signed)
Patient returned the call and is aware of H&P on 1/18 @ 2:10pm (combined w/ already scheduled med f/u), Pre-admit testing to be scheduled, COVID testing on 2/5, and OR on 06/15/19. Patient is aware to quarantine after COVID testing. Patient is aware she may receive calls from the [facility] and [facility]. Patient confirmed BCBS, and no secondary insurance.

## 2019-05-21 NOTE — Telephone Encounter (Signed)
Lmtrc

## 2019-05-24 ENCOUNTER — Other Ambulatory Visit: Payer: Self-pay

## 2019-05-24 ENCOUNTER — Ambulatory Visit (INDEPENDENT_AMBULATORY_CARE_PROVIDER_SITE_OTHER): Payer: BC Managed Care – PPO | Admitting: Obstetrics and Gynecology

## 2019-05-24 ENCOUNTER — Encounter: Payer: Self-pay | Admitting: Obstetrics and Gynecology

## 2019-05-24 VITALS — BP 130/80 | Ht 66.0 in | Wt 234.0 lb

## 2019-05-24 DIAGNOSIS — Z01818 Encounter for other preprocedural examination: Secondary | ICD-10-CM | POA: Diagnosis not present

## 2019-05-24 DIAGNOSIS — N840 Polyp of corpus uteri: Secondary | ICD-10-CM

## 2019-05-24 DIAGNOSIS — N921 Excessive and frequent menstruation with irregular cycle: Secondary | ICD-10-CM

## 2019-05-24 NOTE — H&P (View-Only) (Signed)
  Preoperative History and Physical  Valerie Massey is a 39 y.o. G0P0000 here for surgical management of menorrhagia with irregular cycle.   No significant preoperative concerns.  History of Present Illness: 39 y.o. G0P0000 female who presents   Proposed surgery: Her menses have been normal up until August where she would have a period monthly, lasting 5-7 days, heavy with passage of clots.  She had no pain with periods prior to August. Since August she has had dull-brown to pink to red spotting 5 days out of 7.  She has also been having bleeding during and after intercourse.  She has also been having pain on her right side that feels like ovulation-like pain, but worse. She took a pregnancy test two weeks ago, which was negative.  She had an essentially normal pelvic ultrasound.    She had a negative pap smear. Endometrial biopsy showed an benign endometrial polyp, which was not visualized on ultrasound.  Of note, she underwent a hysteroscopy, dilation and curettage, attempted polypectomy for the same endometrial biopsy result with no evidence of polyp visually, though fragments of an endometrial polyp were noted on pathology. She desires definitive management with this treatment.   Past Medical History:  Diagnosis Date  . Anemia   . BRCA negative 2015   MyRisk neg  . Depression   . Family history of breast cancer 2015   IBIS=14.1%  . Family history of ovarian cancer   . History of hiatal hernia   . Migraines    Past Surgical History:  Procedure Laterality Date  . DILATATION & CURETTAGE/HYSTEROSCOPY WITH MYOSURE N/A 03/12/2018   Procedure: DILATATION & CURETTAGE/HYSTEROSCOPY WITH MYOSURE;  Surgeon: Adetokunbo Mccadden D, MD;  Location: [facility] ORS;  Service: Gynecology;  Laterality: N/A;  . DILATION AND CURETTAGE OF UTERUS    . WISDOM TOOTH EXTRACTION     OB History  Gravida Para Term Preterm AB Living  0 0 0 0 0 0  SAB TAB Ectopic Multiple Live Births  0 0 0 0 0  Patient denies any  other pertinent gynecologic issues.   Current Outpatient Medications on File Prior to Visit  Medication Sig Dispense Refill  . acetaminophen (TYLENOL) 500 MG tablet Take 500-1,000 mg by mouth every 6 (six) hours as needed for mild pain or moderate pain.    . calcium carbonate (TUMS - DOSED IN MG ELEMENTAL CALCIUM) 500 MG chewable tablet Chew 2 tablets by mouth daily as needed for indigestion or heartburn.    . LORazepam (ATIVAN) 0.5 MG tablet Take 1 tablet (0.5 mg total) by mouth every 12 (twelve) hours as needed for anxiety. 30 tablet 0  . medroxyPROGESTERone (PROVERA) 10 MG tablet Take 1 tablet (10 mg total) by mouth 3 (three) times daily. 84 tablet 0  . Multiple Vitamin (MULTIVITAMIN WITH MINERALS) TABS tablet Take 1 tablet by mouth once a week.    . omeprazole (PRILOSEC) 40 MG capsule Take 40 mg by mouth daily.     . sertraline (ZOLOFT) 50 MG tablet Take 1 tablet (50 mg total) by mouth daily. 30 tablet 2  . albuterol (PROVENTIL HFA) 108 (90 Base) MCG/ACT inhaler Inhale 2 puffs into the lungs every 4 (four) hours as needed for wheezing or shortness of breath. (Patient not taking: Reported on 03/02/2019) 1 Inhaler 0  . diphenhydrAMINE (BENADRYL) 25 MG tablet Take 2 tablets (50 mg total) by mouth every 6 (six) hours for 3 days. 24 tablet 0  . HYDROcodone-acetaminophen (NORCO/VICODIN) 5-325 MG tablet Take   1 tablet by mouth every 8 (eight) hours as needed (breakthrough pain). (Patient not taking: Reported on 03/02/2019) 10 tablet 0  . ibuprofen (ADVIL,MOTRIN) 800 MG tablet Take 1 tablet (800 mg total) by mouth every 8 (eight) hours as needed. (Patient not taking: Reported on 03/02/2019) 30 tablet 0   No current facility-administered medications on file prior to visit.   Allergies  Allergen Reactions  . Bee Venom Hives and Swelling  . Other Nausea And Vomiting    scallops  . Tamiflu [Oseltamivir Phosphate] Hives    Social History:   reports that she has been smoking cigarettes. She started  smoking about 22 years ago. She has been smoking about 0.50 packs per day. She has never used smokeless tobacco. She reports current alcohol use. She reports that she does not use drugs.  Family History  Problem Relation Age of Onset  . Breast cancer Mother 59  . Ovarian cancer Mother 66  . Endometrial cancer Maternal Grandmother 93  . Lung cancer Maternal Grandfather 60  . Lung cancer Paternal Grandfather 22    Review of Systems: Noncontributory  PHYSICAL EXAM: Blood pressure 130/80, height 5' 6" (1.676 m), weight 234 lb (106.1 kg). CONSTITUTIONAL: Well-developed, well-nourished female in no acute distress.  HENT:  Normocephalic, atraumatic, External right and left ear normal. Oropharynx is clear and moist EYES: Conjunctivae and EOM are normal. Pupils are equal, round, and reactive to light. No scleral icterus.  NECK: Normal range of motion, supple, no masses SKIN: Skin is warm and dry. No rash noted. Not diaphoretic. No erythema. No pallor. [place]: Alert and oriented to person, place, and time. Normal reflexes, muscle tone coordination. No cranial nerve deficit noted. PSYCHIATRIC: Normal mood and affect. Normal behavior. Normal judgment and thought content. CARDIOVASCULAR: Normal heart rate noted, regular rhythm RESPIRATORY: Effort and breath sounds normal, no problems with respiration noted ABDOMEN: Soft, nontender, nondistended. PELVIC: Deferred MUSCULOSKELETAL: Normal range of motion. No edema and no tenderness. 2+ distal pulses.  Labs: No results found for this or any previous visit (from the past 336 hour(s)).  Imaging Studies: No results found.  Assessment: Patient Active Problem List   Diagnosis Date Noted  . Endometrial polyp 02/10/2018  . Menorrhagia with irregular cycle 01/28/2018    Plan: Patient will undergo surgical management with Total laparoscopic hysterectomy, bilateral salpingectomy, cystoscopy.   The risks of surgery were discussed in detail with the  patient including but not limited to: bleeding which may require transfusion or reoperation; infection which may require antibiotics; injury to surrounding organs which may involve bowel, bladder, ureters ; need for additional procedures including laparoscopy or laparotomy; thromboembolic phenomenon, surgical site problems and other postoperative/anesthesia complications. Likelihood of success in alleviating the patient's condition was discussed. Routine postoperative instructions will be reviewed with the patient and her family in detail after surgery.  The patient concurred with the proposed plan, giving informed written consent for the surgery.  Preoperative prophylactic antibiotics, as indicated, and SCDs ordered on call to the OR.  She understands she will no longer be able to become pregnant after hysterectomy.   Prentice Docker, MD 05/24/2019 3:00 PM

## 2019-05-24 NOTE — Progress Notes (Signed)
Preoperative History and Physical  [name] is a 39 y.o. G0P0000 here for surgical management of menorrhagia with irregular cycle.   No significant preoperative concerns.  History of Present Illness: 39 y.o. Valerie Massey female who presents   Proposed surgery: Her menses have been normal up until August where she would have a period monthly, lasting 5-7 days, heavy with passage of clots.  She had no pain with periods prior to August. Since August she has had dull-brown to pink to red spotting 5 days out of 7.  She has also been having bleeding during and after intercourse.  She has also been having pain on her right side that feels like ovulation-like pain, but worse. She took a pregnancy test two weeks ago, which was negative.  She had an essentially normal pelvic ultrasound.    She had a negative pap smear. Endometrial biopsy showed an benign endometrial polyp, which was not visualized on ultrasound.  Of note, she underwent a hysteroscopy, dilation and curettage, attempted polypectomy for the same endometrial biopsy result with no evidence of polyp visually, though fragments of an endometrial polyp were noted on pathology. She desires definitive management with this treatment.   Past Medical History:  Diagnosis Date  . Anemia   . BRCA negative 2015   MyRisk neg  . Depression   . Family history of breast cancer 2015   IBIS=14.1%  . Family history of ovarian cancer   . History of hiatal hernia   . Migraines    Past Surgical History:  Procedure Laterality Date  . DILATATION & CURETTAGE/HYSTEROSCOPY WITH MYOSURE N/A 03/12/2018   Procedure: DILATATION & CURETTAGE/HYSTEROSCOPY WITH MYOSURE;  Surgeon: Will Bonnet, MD;  Location: [facility] ORS;  Service: Gynecology;  Laterality: N/A;  . DILATION AND CURETTAGE OF UTERUS    . WISDOM TOOTH EXTRACTION     OB History  Gravida Para Term Preterm AB Living  0 0 0 0 0 0  SAB TAB Ectopic Multiple Live Births  0 0 0 0 0  Patient denies any  other pertinent gynecologic issues.   Current Outpatient Medications on File Prior to Visit  Medication Sig Dispense Refill  . acetaminophen (TYLENOL) 500 MG tablet Take 500-1,000 mg by mouth every 6 (six) hours as needed for mild pain or moderate pain.    . calcium carbonate (TUMS - DOSED IN MG ELEMENTAL CALCIUM) 500 MG chewable tablet Chew 2 tablets by mouth daily as needed for indigestion or heartburn.    Marland Kitchen LORazepam (ATIVAN) 0.5 MG tablet Take 1 tablet (0.5 mg total) by mouth every 12 (twelve) hours as needed for anxiety. 30 tablet 0  . medroxyPROGESTERone (PROVERA) 10 MG tablet Take 1 tablet (10 mg total) by mouth 3 (three) times daily. 84 tablet 0  . Multiple Vitamin (MULTIVITAMIN WITH MINERALS) TABS tablet Take 1 tablet by mouth once a week.    Marland Kitchen omeprazole (PRILOSEC) 40 MG capsule Take 40 mg by mouth daily.     . sertraline (ZOLOFT) 50 MG tablet Take 1 tablet (50 mg total) by mouth daily. 30 tablet 2  . albuterol (PROVENTIL HFA) 108 (90 Base) MCG/ACT inhaler Inhale 2 puffs into the lungs every 4 (four) hours as needed for wheezing or shortness of breath. (Patient not taking: Reported on 03/02/2019) 1 Inhaler 0  . diphenhydrAMINE (BENADRYL) 25 MG tablet Take 2 tablets (50 mg total) by mouth every 6 (six) hours for 3 days. 24 tablet 0  . HYDROcodone-acetaminophen (NORCO/VICODIN) 5-325 MG tablet Take  1 tablet by mouth every 8 (eight) hours as needed (breakthrough pain). (Patient not taking: Reported on 03/02/2019) 10 tablet 0  . ibuprofen (ADVIL,MOTRIN) 800 MG tablet Take 1 tablet (800 mg total) by mouth every 8 (eight) hours as needed. (Patient not taking: Reported on 03/02/2019) 30 tablet 0   No current facility-administered medications on file prior to visit.   Allergies  Allergen Reactions  . Bee Venom Hives and Swelling  . Other Nausea And Vomiting    scallops  . Tamiflu [Oseltamivir Phosphate] Hives    Social History:   reports that she has been smoking cigarettes. She started  smoking about 22 years ago. She has been smoking about 0.50 packs per day. She has never used smokeless tobacco. She reports current alcohol use. She reports that she does not use drugs.  Family History  Problem Relation Age of Onset  . Breast cancer Mother 59  . Ovarian cancer Mother 66  . Endometrial cancer Maternal Grandmother 93  . Lung cancer Maternal Grandfather 60  . Lung cancer Paternal Grandfather 22    Review of Systems: Noncontributory  PHYSICAL EXAM: Blood pressure 130/80, height 5' 6" (1.676 m), weight 234 lb (106.1 kg). CONSTITUTIONAL: Well-developed, well-nourished female in no acute distress.  HENT:  Normocephalic, atraumatic, External right and left ear normal. Oropharynx is clear and moist EYES: Conjunctivae and EOM are normal. Pupils are equal, round, and reactive to light. No scleral icterus.  NECK: Normal range of motion, supple, no masses SKIN: Skin is warm and dry. No rash noted. Not diaphoretic. No erythema. No pallor. [place]: Alert and oriented to person, place, and time. Normal reflexes, muscle tone coordination. No cranial nerve deficit noted. PSYCHIATRIC: Normal mood and affect. Normal behavior. Normal judgment and thought content. CARDIOVASCULAR: Normal heart rate noted, regular rhythm RESPIRATORY: Effort and breath sounds normal, no problems with respiration noted ABDOMEN: Soft, nontender, nondistended. PELVIC: Deferred MUSCULOSKELETAL: Normal range of motion. No edema and no tenderness. 2+ distal pulses.  Labs: No results found for this or any previous visit (from the past 336 hour(s)).  Imaging Studies: No results found.  Assessment: Patient Active Problem List   Diagnosis Date Noted  . Endometrial polyp 02/10/2018  . Menorrhagia with irregular cycle 01/28/2018    Plan: Patient will undergo surgical management with Total laparoscopic hysterectomy, bilateral salpingectomy, cystoscopy.   The risks of surgery were discussed in detail with the  patient including but not limited to: bleeding which may require transfusion or reoperation; infection which may require antibiotics; injury to surrounding organs which may involve bowel, bladder, ureters ; need for additional procedures including laparoscopy or laparotomy; thromboembolic phenomenon, surgical site problems and other postoperative/anesthesia complications. Likelihood of success in alleviating the patient's condition was discussed. Routine postoperative instructions will be reviewed with the patient and her family in detail after surgery.  The patient concurred with the proposed plan, giving informed written consent for the surgery.  Preoperative prophylactic antibiotics, as indicated, and SCDs ordered on call to the OR.  She understands she will no longer be able to become pregnant after hysterectomy.   Prentice Docker, MD 05/24/2019 3:00 PM

## 2019-06-07 ENCOUNTER — Encounter: Payer: Self-pay | Admitting: *Deleted

## 2019-06-07 ENCOUNTER — Other Ambulatory Visit: Payer: Self-pay

## 2019-06-07 ENCOUNTER — Encounter
Admission: RE | Admit: 2019-06-07 | Discharge: 2019-06-07 | Disposition: A | Discharge status: Home / Self Care | Payer: BC Managed Care – PPO | Source: Ambulatory Visit | Attending: Obstetrics and Gynecology | Admitting: Obstetrics and Gynecology

## 2019-06-07 HISTORY — DX: Bronchitis, not specified as acute or chronic: J40

## 2019-06-07 HISTORY — DX: Gastro-esophageal reflux disease without esophagitis: K21.9

## 2019-06-07 NOTE — Patient Instructions (Addendum)
Your procedure is scheduled on: Tuesday, February 9 Report to Day Surgery on the 2nd floor of the [facility]. To find out your arrival time, please call [phone] between 1PM - 3PM on: Monday, February 8  REMEMBER: Instructions that are not followed completely may result in serious medical risk, up to and including death; or upon the discretion of your surgeon and anesthesiologist your surgery may need to be rescheduled.  Do not eat food after midnight the night before surgery.  No gum chewing, lozengers or hard candies.  You may however, drink CLEAR liquids up to 2 hours before you are scheduled to arrive for your surgery. Do not drink anything within 2 hours of the start of your surgery.  Clear liquids include: - water  - apple juice without pulp - gatorade - black coffee or tea (Do NOT add milk or creamers to the coffee or tea) Do NOT drink anything that is not on this list.  ENSURE PRE-SURGERY CARBOHYDRATE DRINK:  Complete drinking 3 hours prior to surgery.  No Alcohol for 24 hours before or after surgery.  No Smoking including e-cigarettes for 24 hours prior to surgery.  No chewable tobacco products for at least 6 hours prior to surgery.  No nicotine patches on the day of surgery.  On the morning of surgery brush your teeth with toothpaste and water, you may rinse your mouth with mouthwash if you wish. Do not swallow any toothpaste or mouthwash.  Notify your doctor if there is any change in your medical condition (cold, fever, infection).  Do not wear jewelry, make-up, hairpins, clips or nail polish.  Do not wear lotions, powders, or perfumes.   Do not shave 48 hours prior to surgery.   Contacts and dentures may not be worn into surgery.  Do not bring valuables to the hospital, including drivers license, insurance or credit cards.  [facility] is not responsible for any belongings or valuables.   TAKE THESE MEDICATIONS THE MORNING OF SURGERY:  1.  Omeprazole   - (take one the night before and one on the morning of surgery - helps to prevent nausea after surgery.) 2.  Sertraline  Use CHG Soap as directed on instruction sheet.  Stop Anti-inflammatories (NSAIDS) such as Advil, Aleve, Ibuprofen, Motrin, Naproxen, Naprosyn and Aspirin based products such as Excedrin, Goodys Powder, BC Powder. (May take Tylenol or Acetaminophen if needed.)  Stop ANY OVER THE COUNTER supplements until after surgery. (May continue multivitamin.)  Wear comfortable clothing (specific to your surgery type) to the hospital.  Plan for stool softeners for home use.  If you are being discharged the day of surgery, you will not be allowed to drive home. You will need a responsible adult to drive you home and stay with you that night.   If you are taking public transportation, you will need to have a responsible adult with you. Please confirm with your physician that it is acceptable to use public transportation.   Please call [phone] if you have any questions about these instructions.

## 2019-06-11 ENCOUNTER — Other Ambulatory Visit
Admission: RE | Admit: 2019-06-11 | Discharge: 2019-06-11 | Disposition: A | Discharge status: Home / Self Care | Payer: BC Managed Care – PPO | Source: Ambulatory Visit | Attending: Obstetrics and Gynecology | Admitting: Obstetrics and Gynecology

## 2019-06-11 DIAGNOSIS — Z01812 Encounter for preprocedural laboratory examination: Secondary | ICD-10-CM | POA: Diagnosis not present

## 2019-06-11 DIAGNOSIS — Z20822 Contact with and (suspected) exposure to covid-19: Secondary | ICD-10-CM | POA: Insufficient documentation

## 2019-06-11 LAB — CBC
HCT: 37.9 % (ref 36.0–46.0)
Hemoglobin: 12.1 g/dL (ref 12.0–15.0)
MCH: 25.4 pg — ABNORMAL LOW (ref 26.0–34.0)
MCHC: 31.9 g/dL (ref 30.0–36.0)
MCV: 79.6 fL — ABNORMAL LOW (ref 80.0–100.0)
Platelets: 336 10*3/uL (ref 150–400)
RBC: 4.76 MIL/uL (ref 3.87–5.11)
RDW: 15 % (ref 11.5–15.5)
WBC: 7.9 10*3/uL (ref 4.0–10.5)
nRBC: 0 % (ref 0.0–0.2)

## 2019-06-11 LAB — COMPREHENSIVE METABOLIC PANEL
ALT: 16 U/L (ref 0–44)
AST: 17 U/L (ref 15–41)
Albumin: 4 g/dL (ref 3.5–5.0)
Alkaline Phosphatase: 62 U/L (ref 38–126)
Anion gap: 10 (ref 5–15)
BUN: 9 mg/dL (ref 6–20)
CO2: 23 mmol/L (ref 22–32)
Calcium: 9 mg/dL (ref 8.9–10.3)
Chloride: 108 mmol/L (ref 98–111)
Creatinine, Ser: 0.7 mg/dL (ref 0.44–1.00)
GFR calc Af Amer: >60 mL/min (ref >60)
GFR calc non Af Amer: >60 mL/min (ref >60)
Glucose, Bld: 154 mg/dL — ABNORMAL HIGH (ref 70–99)
Potassium: 3.8 mmol/L (ref 3.5–5.1)
Sodium: 141 mmol/L (ref 135–145)
Total Bilirubin: 0.4 mg/dL (ref 0.3–1.2)
Total Protein: 7.9 g/dL (ref 6.5–8.1)

## 2019-06-11 LAB — TYPE AND SCREEN
ABO/RH(D): A POS
Antibody Screen: NEGATIVE

## 2019-06-11 LAB — SARS CORONAVIRUS 2 (TAT 6-24 HRS): SARS Coronavirus 2: NEGATIVE

## 2019-06-14 MED ORDER — CEFAZOLIN SODIUM-DEXTROSE 2-4 GM/100ML-% IV SOLN
2.0000 g | INTRAVENOUS | Status: AC
  Administered 2019-06-15: 08:00:00 2 g via INTRAVENOUS

## 2019-06-15 ENCOUNTER — Other Ambulatory Visit: Payer: Self-pay

## 2019-06-15 ENCOUNTER — Ambulatory Visit
Admission: RE | Admit: 2019-06-15 | Discharge: 2019-06-15 | Disposition: A | Discharge status: Home / Self Care | Payer: BC Managed Care – PPO | Attending: Obstetrics and Gynecology | Admitting: Obstetrics and Gynecology

## 2019-06-15 ENCOUNTER — Encounter: Payer: Self-pay | Admitting: Obstetrics and Gynecology

## 2019-06-15 ENCOUNTER — Ambulatory Visit: Payer: BC Managed Care – PPO | Admitting: Certified Registered Nurse Anesthetist

## 2019-06-15 ENCOUNTER — Encounter
Admission: RE | Disposition: A | Discharge status: Home / Self Care | Payer: Self-pay | Source: Home / Self Care | Attending: Obstetrics and Gynecology

## 2019-06-15 DIAGNOSIS — F329 Major depressive disorder, single episode, unspecified: Secondary | ICD-10-CM | POA: Diagnosis not present

## 2019-06-15 DIAGNOSIS — N840 Polyp of corpus uteri: Secondary | ICD-10-CM

## 2019-06-15 DIAGNOSIS — Z91030 Bee allergy status: Secondary | ICD-10-CM | POA: Insufficient documentation

## 2019-06-15 DIAGNOSIS — Z793 Long term (current) use of hormonal contraceptives: Secondary | ICD-10-CM | POA: Insufficient documentation

## 2019-06-15 DIAGNOSIS — F17210 Nicotine dependence, cigarettes, uncomplicated: Secondary | ICD-10-CM | POA: Diagnosis not present

## 2019-06-15 DIAGNOSIS — Z91013 Allergy to seafood: Secondary | ICD-10-CM | POA: Insufficient documentation

## 2019-06-15 DIAGNOSIS — N921 Excessive and frequent menstruation with irregular cycle: Secondary | ICD-10-CM

## 2019-06-15 DIAGNOSIS — N920 Excessive and frequent menstruation with regular cycle: Secondary | ICD-10-CM | POA: Insufficient documentation

## 2019-06-15 DIAGNOSIS — Z887 Allergy status to serum and vaccine status: Secondary | ICD-10-CM | POA: Insufficient documentation

## 2019-06-15 DIAGNOSIS — Z79899 Other long term (current) drug therapy: Secondary | ICD-10-CM | POA: Diagnosis not present

## 2019-06-15 DIAGNOSIS — N841 Polyp of cervix uteri: Secondary | ICD-10-CM | POA: Diagnosis not present

## 2019-06-15 DIAGNOSIS — K219 Gastro-esophageal reflux disease without esophagitis: Secondary | ICD-10-CM | POA: Diagnosis not present

## 2019-06-15 HISTORY — PX: TOTAL LAPAROSCOPIC HYSTERECTOMY WITH SALPINGECTOMY: SHX6742

## 2019-06-15 HISTORY — PX: CYSTOSCOPY: SHX5120

## 2019-06-15 LAB — ABO/RH: ABO/RH(D): A POS

## 2019-06-15 LAB — POCT PREGNANCY, URINE: Preg Test, Ur: NEGATIVE

## 2019-06-15 SURGERY — HYSTERECTOMY, TOTAL, LAPAROSCOPIC, WITH SALPINGECTOMY
Anesthesia: General

## 2019-06-15 MED ORDER — ONDANSETRON 4 MG PO TBDP: 4.0000 mg | ORAL_TABLET | Freq: Three times a day (TID) | ORAL | 0 refills | Status: DC | PRN

## 2019-06-15 MED ORDER — MIDAZOLAM HCL 2 MG/2ML IJ SOLN
INTRAMUSCULAR | Status: DC | PRN
  Administered 2019-06-15: 2 mg via INTRAVENOUS

## 2019-06-15 MED ORDER — PROPOFOL 10 MG/ML IV BOLUS
INTRAVENOUS | Status: AC
  Filled 2019-06-15: qty 20

## 2019-06-15 MED ORDER — ACETAMINOPHEN 10 MG/ML IV SOLN
INTRAVENOUS | Status: DC | PRN
  Administered 2019-06-15: 1000 mg via INTRAVENOUS

## 2019-06-15 MED ORDER — MIDAZOLAM HCL 2 MG/2ML IJ SOLN
INTRAMUSCULAR | Status: AC
  Filled 2019-06-15: qty 2

## 2019-06-15 MED ORDER — HYDROMORPHONE HCL 1 MG/ML IJ SOLN
INTRAMUSCULAR | Status: DC | PRN
  Administered 2019-06-15 (×2): .5 mg via INTRAVENOUS

## 2019-06-15 MED ORDER — SCOPOLAMINE 1 MG/3DAYS TD PT72
MEDICATED_PATCH | TRANSDERMAL | Status: AC
  Filled 2019-06-15: qty 1

## 2019-06-15 MED ORDER — ONDANSETRON HCL 4 MG/2ML IJ SOLN
INTRAMUSCULAR | Status: DC | PRN
  Administered 2019-06-15: 4 mg via INTRAVENOUS

## 2019-06-15 MED ORDER — FENTANYL CITRATE (PF) 100 MCG/2ML IJ SOLN
INTRAMUSCULAR | Status: DC | PRN
  Administered 2019-06-15: 100 ug via INTRAVENOUS

## 2019-06-15 MED ORDER — IBUPROFEN 600 MG PO TABS: 600.0000 mg | ORAL_TABLET | Freq: Four times a day (QID) | ORAL | 0 refills | Status: DC

## 2019-06-15 MED ORDER — OXYCODONE-ACETAMINOPHEN 5-325 MG PO TABS: 1.0000 | ORAL_TABLET | ORAL | 0 refills | Status: DC | PRN

## 2019-06-15 MED ORDER — FENTANYL CITRATE (PF) 100 MCG/2ML IJ SOLN
INTRAMUSCULAR | Status: AC
  Filled 2019-06-15: qty 2

## 2019-06-15 MED ORDER — PHENYLEPHRINE HCL (PRESSORS) 10 MG/ML IV SOLN
INTRAVENOUS | Status: DC | PRN
  Administered 2019-06-15: 100 ug via INTRAVENOUS

## 2019-06-15 MED ORDER — OXYCODONE HCL 5 MG PO TABS
ORAL_TABLET | ORAL | Status: AC
  Filled 2019-06-15: qty 1

## 2019-06-15 MED ORDER — CEFAZOLIN SODIUM-DEXTROSE 2-4 GM/100ML-% IV SOLN
INTRAVENOUS | Status: AC
  Filled 2019-06-15: qty 100

## 2019-06-15 MED ORDER — LIDOCAINE HCL (CARDIAC) PF 100 MG/5ML IV SOSY
PREFILLED_SYRINGE | INTRAVENOUS | Status: DC | PRN
  Administered 2019-06-15: 80 mg via INTRAVENOUS

## 2019-06-15 MED ORDER — LACTATED RINGERS IV SOLN
INTRAVENOUS | Status: DC
  Administered 2019-06-15: 125 mL/h via INTRAVENOUS

## 2019-06-15 MED ORDER — SUGAMMADEX SODIUM 200 MG/2ML IV SOLN
INTRAVENOUS | Status: DC | PRN
  Administered 2019-06-15: 211.4 mg via INTRAVENOUS

## 2019-06-15 MED ORDER — KETOROLAC TROMETHAMINE 30 MG/ML IJ SOLN
INTRAMUSCULAR | Status: DC | PRN
  Administered 2019-06-15: 30 mg via INTRAVENOUS

## 2019-06-15 MED ORDER — SCOPOLAMINE 1 MG/3DAYS TD PT72
1.0000 | MEDICATED_PATCH | TRANSDERMAL | Status: DC
  Administered 2019-06-15: 07:00:00 1.5 mg via TRANSDERMAL

## 2019-06-15 MED ORDER — PROMETHAZINE HCL 25 MG/ML IJ SOLN: 6.2500 mg | INTRAMUSCULAR | Status: DC | PRN

## 2019-06-15 MED ORDER — FENTANYL CITRATE (PF) 100 MCG/2ML IJ SOLN
25.0000 ug | INTRAMUSCULAR | Status: DC | PRN
  Administered 2019-06-15 (×4): 25 ug via INTRAVENOUS

## 2019-06-15 MED ORDER — OXYCODONE HCL 5 MG PO TABS
5.0000 mg | ORAL_TABLET | Freq: Once | ORAL | Status: AC | PRN
  Administered 2019-06-15: 5 mg via ORAL

## 2019-06-15 MED ORDER — BUPIVACAINE HCL 0.5 % IJ SOLN
INTRAMUSCULAR | Status: DC | PRN
  Administered 2019-06-15: 10 mL

## 2019-06-15 MED ORDER — ROCURONIUM BROMIDE 100 MG/10ML IV SOLN
INTRAVENOUS | Status: DC | PRN
  Administered 2019-06-15: 50 mg via INTRAVENOUS

## 2019-06-15 MED ORDER — PROPOFOL 10 MG/ML IV BOLUS
INTRAVENOUS | Status: DC | PRN
  Administered 2019-06-15: 150 mg via INTRAVENOUS

## 2019-06-15 MED ORDER — OXYCODONE HCL 5 MG/5ML PO SOLN: 5.0000 mg | Freq: Once | ORAL | Status: AC | PRN

## 2019-06-15 MED ORDER — DEXAMETHASONE SODIUM PHOSPHATE 10 MG/ML IJ SOLN
INTRAMUSCULAR | Status: DC | PRN
  Administered 2019-06-15: 10 mg via INTRAVENOUS

## 2019-06-15 MED ORDER — LACTATED RINGERS IV SOLN: INTRAVENOUS | Status: DC

## 2019-06-15 MED ORDER — HYDROMORPHONE HCL 1 MG/ML IJ SOLN
INTRAMUSCULAR | Status: AC
  Filled 2019-06-15: qty 1

## 2019-06-15 SURGICAL SUPPLY — 57 items
BAG URINE DRAIN 2000ML AR STRL (UROLOGICAL SUPPLIES) ×3 IMPLANT
BLADE SURG SZ11 CARB STEEL (BLADE) ×3 IMPLANT
CATH FOLEY 2WAY  5CC 16FR (CATHETERS) ×2
CATH URTH 16FR FL 2W BLN LF (CATHETERS) ×1 IMPLANT
CHLORAPREP W/TINT 26 (MISCELLANEOUS) ×3 IMPLANT
COVER WAND RF STERILE (DRAPES) ×3 IMPLANT
DEFOGGER SCOPE WARMER CLEARIFY (MISCELLANEOUS) ×3 IMPLANT
DERMABOND ADVANCED (GAUZE/BANDAGES/DRESSINGS) ×2
DERMABOND ADVANCED .7 DNX12 (GAUZE/BANDAGES/DRESSINGS) ×1 IMPLANT
DEVICE SUTURE ENDOST 10MM (ENDOMECHANICALS) ×2 IMPLANT
DRAPE 3/4 80X56 (DRAPES) ×3 IMPLANT
DRAPE LEGGINS SURG 28X43 STRL (DRAPES) ×3 IMPLANT
DRAPE UNDER BUTTOCK W/FLU (DRAPES) ×3 IMPLANT
GAUZE 4X4 16PLY RFD (DISPOSABLE) ×3 IMPLANT
GLOVE BIO SURGEON STRL SZ7 (GLOVE) ×9 IMPLANT
GLOVE BIOGEL PI IND STRL 7.5 (GLOVE) ×1 IMPLANT
GLOVE BIOGEL PI INDICATOR 7.5 (GLOVE) ×2
GLOVE INDICATOR 7.5 STRL GRN (GLOVE) ×3 IMPLANT
GOWN STRL REUS W/ TWL LRG LVL3 (GOWN DISPOSABLE) ×3 IMPLANT
GOWN STRL REUS W/ TWL XL LVL3 (GOWN DISPOSABLE) ×1 IMPLANT
GOWN STRL REUS W/TWL LRG LVL3 (GOWN DISPOSABLE) ×6
GOWN STRL REUS W/TWL XL LVL3 (GOWN DISPOSABLE) ×2
GRASPER SUT TROCAR 14GX15 (MISCELLANEOUS) ×5 IMPLANT
IRRIGATION STRYKERFLOW (MISCELLANEOUS) IMPLANT
IRRIGATOR STRYKERFLOW (MISCELLANEOUS) ×3
IV LACTATED RINGERS 1000ML (IV SOLUTION) ×6 IMPLANT
KIT PINK PAD W/HEAD ARE REST (MISCELLANEOUS) ×3
KIT PINK PAD W/HEAD ARM REST (MISCELLANEOUS) ×1 IMPLANT
KIT TURNOVER CYSTO (KITS) ×3 IMPLANT
LABEL OR SOLS (LABEL) ×3 IMPLANT
LIGASURE VESSEL 5MM BLUNT TIP (ELECTROSURGICAL) ×2 IMPLANT
MANIPULATOR VCARE LG CRV RETR (MISCELLANEOUS) IMPLANT
MANIPULATOR VCARE SML CRV RETR (MISCELLANEOUS) IMPLANT
MANIPULATOR VCARE STD CRV RETR (MISCELLANEOUS) ×2 IMPLANT
NEEDLE HYPO 22GX1.5 SAFETY (NEEDLE) ×3 IMPLANT
OCCLUDER COLPOPNEUMO (BALLOONS) ×3 IMPLANT
PACK LAP CHOLECYSTECTOMY (MISCELLANEOUS) ×3 IMPLANT
PAD OB MATERNITY 4.3X12.25 (PERSONAL CARE ITEMS) ×3 IMPLANT
PAD PREP 24X41 OB/GYN DISP (PERSONAL CARE ITEMS) ×3 IMPLANT
SCISSORS METZENBAUM CVD 33 (INSTRUMENTS) ×3 IMPLANT
SET CYSTO W/LG BORE CLAMP LF (SET/KITS/TRAYS/PACK) ×3 IMPLANT
SET TRI-LUMEN FLTR TB AIRSEAL (TUBING) ×2 IMPLANT
SLEEVE ENDOPATH XCEL 5M (ENDOMECHANICALS) ×3 IMPLANT
SOL PREP PVP 2OZ (MISCELLANEOUS) ×3
SOLUTION PREP PVP 2OZ (MISCELLANEOUS) ×1 IMPLANT
SURGILUBE 2OZ TUBE FLIPTOP (MISCELLANEOUS) ×3 IMPLANT
SUT ENDO VLOC 180-0-8IN (SUTURE) ×3 IMPLANT
SUT MNCRL 4-0 (SUTURE) ×2
SUT MNCRL 4-0 27XMFL (SUTURE) ×1
SUT VIC AB 0 CT1 36 (SUTURE) ×3 IMPLANT
SUTURE MNCRL 4-0 27XMF (SUTURE) ×1 IMPLANT
SYR 10ML LL (SYRINGE) ×6 IMPLANT
SYR 50ML LL SCALE MARK (SYRINGE) ×3 IMPLANT
TROCAR ENDO BLADELESS 11MM (ENDOMECHANICALS) ×1 IMPLANT
TROCAR PORT AIRSEAL 8X100 (TROCAR) ×2 IMPLANT
TROCAR XCEL NON-BLD 5MMX100MML (ENDOMECHANICALS) ×3 IMPLANT
TUBING EVAC SMOKE HEATED PNEUM (TUBING) ×3 IMPLANT

## 2019-06-15 NOTE — Anesthesia Preprocedure Evaluation (Addendum)
Anesthesia Evaluation  Patient identified by MRN, date of birth, ID band Patient awake    Reviewed: Allergy & Precautions, H&P , NPO status , Patient's Chart, lab work & pertinent test results  Airway Mallampati: II  TM Distance: >3 FB Neck ROM: full    Dental  (+) Chipped   Pulmonary Current Smoker and Patient abstained from smoking.,           Cardiovascular negative cardio ROS       Neuro/Psych  Headaches, PSYCHIATRIC DISORDERS Depression    GI/Hepatic Neg liver ROS, hiatal hernia, GERD  Controlled,  Endo/Other  negative endocrine ROS  Renal/GU      Musculoskeletal   Abdominal   Peds  Hematology negative hematology ROS (+)   Anesthesia Other Findings Obesity  Past Medical History: No date: Anemia 2015: BRCA negative     Comment:  MyRisk neg No date: Bronchitis No date: Depression 2015: Family history of breast cancer     Comment:  IBIS=14.1% No date: Family history of ovarian cancer No date: GERD (gastroesophageal reflux disease) No date: History of hiatal hernia No date: Migraines  Past Surgical History: 03/12/2018: DILATATION & CURETTAGE/HYSTEROSCOPY WITH MYOSURE; N/A     Comment:  Procedure: DILATATION & CURETTAGE/HYSTEROSCOPY WITH               MYOSURE;  Surgeon: Will Bonnet, MD;  Location:               [facility] ORS;  Service: Gynecology;  Laterality: N/A; No date: DILATION AND CURETTAGE OF UTERUS No date: WISDOM TOOTH EXTRACTION  BMI    Body Mass Index: 37.61 kg/m      Reproductive/Obstetrics negative OB ROS                             Anesthesia Physical Anesthesia Plan  ASA: III  Anesthesia Plan: General ETT   Post-op Pain Management:    Induction:   PONV Risk Score and Plan: Ondansetron, Dexamethasone, Midazolam, Treatment may vary due to age or medical condition and Scopolamine patch - Pre-op  Airway Management Planned:   Additional Equipment:    Intra-op Plan:   Post-operative Plan:   Informed Consent: I have reviewed the patients History and Physical, chart, labs and discussed the procedure including the risks, benefits and alternatives for the proposed anesthesia with the patient or authorized representative who has indicated his/her understanding and acceptance.     Dental Advisory Given  Plan Discussed with: Anesthesiologist  Anesthesia Plan Comments:        Anesthesia Quick Evaluation

## 2019-06-15 NOTE — Discharge Instructions (Signed)

## 2019-06-15 NOTE — Transfer of Care (Signed)
Immediate Anesthesia Transfer of Care Note  Patient: Valerie Massey  Procedure(s) Performed: TOTAL LAPAROSCOPIC HYSTERECTOMY WITH BILATERAL SALPINGECTOMY (N/A ) CYSTOSCOPY (N/A )  Patient Location: PACU  Anesthesia Type:General  Level of Consciousness: awake, alert  and oriented  Airway & Oxygen Therapy: Patient Spontanous Breathing and Patient connected to face mask oxygen  Post-op Assessment: Report given to RN and Post -op Vital signs reviewed and stable  Post vital signs: Reviewed and stable  Last Vitals:  Vitals Value Taken Time  BP 124/69 06/15/19 0931  Temp 36.1 C 06/15/19 0929  Pulse 77 06/15/19 0934  Resp 13 06/15/19 0934  SpO2 97 % 06/15/19 0934  Vitals shown include unvalidated device data.  Last Pain:  Vitals:   06/15/19 0929  TempSrc:   PainSc: 0-No pain      Patients Stated Pain Goal: 0 ([phone])  Complications: No apparent anesthesia complications

## 2019-06-15 NOTE — Op Note (Signed)
Operative Note    Pre-Operative Diagnosis:  1) Menorrhagia with irregular cycle 2) Endometrial polyp  Post-Operative Diagnosis:  1) Menorrhagia with irregular cycle 2) Endometrial polyp  Procedures:  1. Total laparoscopic hysterectomy with bilateral salpingectomy 2. cystoscopy  Primary Surgeon: Prentice Docker, MD   Assistant Surgeon: Barnett Applebaum, MD - No other capable assistant available, in surgery requiring high level assistant.  EBL: 100 mL   IVF: 800 mL   Urine output: 200 mL  Specimens: uterus with cervix and bilateral fallopian tubes  Drains: none  Complications: None   Disposition: PACU   Condition: Stable   Findings:  1) normal appearing uterus, bilateral fallopian tubes, and cervix 2) On cystoscopy, no evidence of bladder wall disruption. Visualization of efflux of urine from the bilateral ureteral orifices.  Lesion next to the right ureteral orifice of uncertain significance (see images from surgery)  Procedure Summary:  The patient was taken to the operating room where general anesthesia was administered and found to be adequate. She was placed in the dorsal supine lithotomy position in Donnelly stirrups and prepped and draped in usual sterile fashion. After a timeout was called, an indwelling catheter was placed in her bladder. A sterile speculum was placed in the vagina and a single-tooth tenaculum was used to grasp the anterior lip of the cervix. A V-Care uterine manipulator was affixed to the uterus and cervix in accordance to the manufacturers recommendations. The speculum and tenaculum was removed from the vagina.  Attention was turned to the abdomen where, after injection of local anesthetic, a 5 mm infraumbilical incision was made with the scalpel. Entry into the abdomen was obtained via Optiview trocar technique (a blunt entry technique with camera visualization through the obturator upon entry). Verification of entry into the abdomen was obtained using  opening pressures. The abdomen was insufflated with CO2. The camera was introduced through the trocar with verification of atraumatic entry. A left lower quadrant 5 mm port was created via direct intra-abdominal camera visualization without difficulty. An 8 mm AirSeal right lower quadrant port was placed in a similar fashion without difficulty.  After inspection of the abdomen and pelvis with the above-noted findings, the bilateral ureters were identified and found to be well away from the operative area of interest. The right fallopian tube was grasped at the fimbriated end and was transected using the LigaSure along the mesosalpinx in a lateral to medial fashion. The LigaSure then was used to transect the right round ligament and the utero-ovarian ligament was transected. Tissue was divided along the right broad ligament to the level of the anterior cervical os. The lower uterine segment was identified and the vesicouterine peritoneum was dissected away from the cervix. The right uterine artery was skeletonized and identified and after ligation was transected with the LigaSure device. The same procedure was carried out on the left side. The colpotomy was performed using monopolar electrocautery in a circumferential fashion following the KOH ring.  The uterus and fallopian tubes and cervix were removed through the vagina.  Attention was returned to the pelvis and closure of the vaginal cuff was undertaken using the V-lock stitch in a running fashion. All vascular pedicles were inspected and found to be hemostatic. Irrigation was undertaken and hemostasis was again verified. The right lower quadrant trocar was removed and the fascia was reapproximated using #0 Vicryl with a single stitch. The intra-abdominal pressure was lowered to 5 mmHg and hemostasis was still present along the vascular pedicles and at the vaginal cuff  closure.  The abdomen was then desufflated of CO2 after removal of all instruments. Five  deep breaths were given by anesthesia to the patient to help with removal of CO2 from the abdomen. The right lower quadrant skin incision was closed using 4-0 Monocryl in a subcuticular fashion. The remaining skin incisions were closed using surgical skin glue and a layer of surgical skin glue was placed over the right lower quadrant skin incision, as well.   Cystoscopy was undertaken at this point. The Foley catheter was removed and the 70 cystoscope was gently introduced through the urethra. The bladder survey was undertaken with efflux of urine from both orifices noted. There were no defects noted in the bladder wall. The cystoscope was removed and the bladder was drained of fluid. The catheter was not replaced.  The vagina was inspected and found to be free of instrumentation and sponges.  The assistant in this case, assisted with visualization, manipulation of the uterus, transection of tissue along the left adnexa (this could not have been performed by an assistant without advanced training), assistance with closure of the vaginal cuff.  The patient tolerated the procedure well.  Sponge, lap, needle, and instrument counts were correct x 2.  VTE prophylaxis: SCDs. Antibiotic prophylaxis: Ancef 2 grams prior to skin incision. She was awakened in the operating room and was taken to the PACU in stable condition. The assistant surgeon was an MD due to lack of availability of another Counselling psychologist.   Prentice Docker, MD 06/15/2019 9:23 AM

## 2019-06-15 NOTE — Anesthesia Procedure Notes (Signed)
Procedure Name: Intubation Date/Time: 06/15/2019 7:36 AM Performed by: Willette Alma, CRNA Pre-anesthesia Checklist: Patient identified, Patient being monitored, Timeout performed, Emergency Drugs available and Suction available Patient Re-evaluated:Patient Re-evaluated prior to induction Oxygen Delivery Method: Circle system utilized Preoxygenation: Pre-oxygenation with 100% oxygen Induction Type: IV induction Ventilation: Mask ventilation without difficulty Laryngoscope Size: Mac and 4 Grade View: Grade I Tube type: Oral Tube size: 7.0 mm Number of attempts: 1 Airway Equipment and Method: Stylet Placement Confirmation: ETT inserted through vocal cords under direct vision,  positive ETCO2 and breath sounds checked- equal and bilateral Secured at: 21 cm Tube secured with: Tape Dental Injury: Teeth and Oropharynx as per pre-operative assessment

## 2019-06-15 NOTE — Interval H&P Note (Signed)
History and Physical Interval Note:  06/15/2019 7:25 AM  Valerie Massey  has presented today for surgery, with the diagnosis of Menorrhagia with irregular cycle N92.1.  The various methods of treatment have been discussed with the patient and family. After consideration of risks, benefits and other options for treatment, the patient has consented to  Procedure(s): TOTAL LAPAROSCOPIC HYSTERECTOMY WITH BILATERAL SALPINGECTOMY (N/A) CYSTOSCOPY (N/A) as a surgical intervention.  The patient's history has been reviewed, patient examined, no change in status, stable for surgery.  I have reviewed the patient's chart and labs.  Questions were answered to the patient's satisfaction.    Prentice Docker, MD, [facility], [facility] 06/15/2019 7:25 AM

## 2019-06-16 ENCOUNTER — Other Ambulatory Visit: Payer: Self-pay | Admitting: Obstetrics and Gynecology

## 2019-06-16 ENCOUNTER — Telehealth: Payer: Self-pay

## 2019-06-16 DIAGNOSIS — N329 Bladder disorder, unspecified: Secondary | ICD-10-CM

## 2019-06-16 LAB — SURGICAL PATHOLOGY

## 2019-06-16 NOTE — Anesthesia Postprocedure Evaluation (Signed)
Anesthesia Post Note  Patient: Valerie Massey  Procedure(s) Performed: TOTAL LAPAROSCOPIC HYSTERECTOMY WITH BILATERAL SALPINGECTOMY (N/A ) CYSTOSCOPY (N/A )  Patient location during evaluation: PACU Anesthesia Type: General Level of consciousness: awake and alert Pain management: pain level controlled Vital Signs Assessment: post-procedure vital signs reviewed and stable Respiratory status: spontaneous breathing, nonlabored ventilation and respiratory function stable Cardiovascular status: blood pressure returned to baseline and stable Postop Assessment: no apparent nausea or vomiting Anesthetic complications: no     Last Vitals:  Vitals:   06/15/19 1100 06/15/19 1124  BP: 111/60 124/63  Pulse: 74 73  Resp: 13 16  Temp: (!) 36.1 C (!) 36.4 C  SpO2: 92% 98%    Last Pain:  Vitals:   06/16/19 0823  TempSrc:   PainSc: [place]

## 2019-06-16 NOTE — Telephone Encounter (Signed)
Just spoke with pt following up on her surgery yesterday (not C-Section), did not mean to put that. Pt states she is feeling well, using the bathroom okay, no fever, right incision is  A little sore ( but she said SDJ told her that would probably happen). Pt will call us if she needs anything before her PO appt

## 2019-06-16 NOTE — Telephone Encounter (Signed)
Please let me know when pt returns my call. Trying to call her to check on her after C-section yesterday. Thank you.

## 2019-06-19 ENCOUNTER — Other Ambulatory Visit: Payer: Self-pay | Admitting: Obstetrics and Gynecology

## 2019-06-19 DIAGNOSIS — N921 Excessive and frequent menstruation with irregular cycle: Secondary | ICD-10-CM

## 2019-06-19 DIAGNOSIS — N840 Polyp of corpus uteri: Secondary | ICD-10-CM

## 2019-06-28 ENCOUNTER — Ambulatory Visit (INDEPENDENT_AMBULATORY_CARE_PROVIDER_SITE_OTHER): Payer: BC Managed Care – PPO | Admitting: Obstetrics and Gynecology

## 2019-06-28 ENCOUNTER — Encounter: Payer: Self-pay | Admitting: Obstetrics and Gynecology

## 2019-06-28 ENCOUNTER — Other Ambulatory Visit: Payer: Self-pay

## 2019-06-28 VITALS — BP 144/82 | Ht 66.0 in | Wt 241.0 lb

## 2019-06-28 DIAGNOSIS — Z09 Encounter for follow-up examination after completed treatment for conditions other than malignant neoplasm: Secondary | ICD-10-CM

## 2019-06-28 DIAGNOSIS — Z90710 Acquired absence of both cervix and uterus: Secondary | ICD-10-CM

## 2019-06-28 DIAGNOSIS — Z9889 Other specified postprocedural states: Secondary | ICD-10-CM

## 2019-06-28 NOTE — Progress Notes (Signed)
   Postoperative Follow-up Patient presents post op from total laparoscopic hysterectomy, bilateral salpingectomy, cystoscopy 2 weeks ago for menorrhagia with irregular cycle, endometrial polyp.  Subjective: Patient reports marked improvement in her preop symptoms. Eating a regular diet without difficulty. She has pain in her mid abdomen, especially when standing or lying.   Activity: increasing slowly. She denies fever (temp to 47F), chills.  She is voiding and having bowel movements. She notes a rash on her right breast she would like to have evaluated.   Objective: Vitals:   06/28/19 1409  BP: (!) 144/82   Vital Signs: BP (!) 144/82   Ht 5\' 6"  (1.676 m)   Wt 241 lb (109.3 kg)   BMI 38.90 kg/m  Constitutional: Well nourished, well developed female in no acute distress.  HEENT: normal Skin: Warm and dry.  Extremity: no edema  Abdomen: Soft, non-tender, normal bowel sounds; no bruits, organomegaly or masses. clean, dry, intact and without erythema, induration, warmth, and tenderness  Right breast with about 7-10 erosions about 2-3 mm in width.  Mild erythema, no induration, no warmth or tenderness. No palpable underlying breast mass.      Assessment: 39 y.o. s/p TLH/BS/Cysto progressing well Right breast erosions.  Small and confined to one area. No underlying breast mass. Apply topical hydrocortisone OTC. If becomes painful or does not improve consider other diagnoses besides a non-specific dermatitis.  Differential would include shingles, underlying breast mass...  Plan: Patient has done well after surgery with no apparent complications.  I have discussed the post-operative course to date, and the expected progress moving forward.  The patient understands what complications to be concerned about.  I will see the patient in routine follow up, or sooner if needed.    Activity plan: increase activity slowly.  No lifting > 25 pounds until at least 6 weeks. No intercourse and  nothing per vagina x a total of 8 weeks. Let me know, if breast rash does not improve or gets worse.    Prentice Docker, MD 06/28/2019, 2:37 PM

## 2019-07-07 ENCOUNTER — Telehealth: Payer: Self-pay

## 2019-07-07 NOTE — Telephone Encounter (Signed)
Pt called triage and said she wanted to give you an update since surgery. Has had one bloody stool only, her mom is a Marine scientist and thinks its the ibuprofen shes taking so she stopped taking it immediately. Also, wants your feedback on going back to work half of the day, 3-4 days a week with no heavy lifting restrictions. Please advise.

## 2019-07-09 NOTE — Telephone Encounter (Signed)
I answered the patient directly since she also wrote a message on Mychart.

## 2019-07-14 ENCOUNTER — Encounter: Payer: Self-pay | Admitting: Urology

## 2019-07-14 ENCOUNTER — Other Ambulatory Visit: Payer: Self-pay | Admitting: Radiology

## 2019-07-14 ENCOUNTER — Other Ambulatory Visit: Payer: Self-pay

## 2019-07-14 ENCOUNTER — Ambulatory Visit (INDEPENDENT_AMBULATORY_CARE_PROVIDER_SITE_OTHER): Payer: BC Managed Care – PPO | Admitting: Urology

## 2019-07-14 VITALS — BP 132/87 | HR 92 | Ht 66.0 in | Wt 230.0 lb

## 2019-07-14 DIAGNOSIS — N329 Bladder disorder, unspecified: Secondary | ICD-10-CM

## 2019-07-14 DIAGNOSIS — N3289 Other specified disorders of bladder: Secondary | ICD-10-CM

## 2019-07-14 MED ORDER — GEMCITABINE CHEMO FOR BLADDER INSTILLATION 2000 MG: 2000.0000 mg | Freq: Once | INTRAVENOUS | Status: AC

## 2019-07-14 NOTE — Telephone Encounter (Signed)
Please forward letter to her email. thanks

## 2019-07-14 NOTE — H&P (View-Only) (Signed)
07/14/19 3:00 PM   Valerie Massey 05-21-1980 [id number]  Referring provider: [facility], [place],  [place] [address]  Chief Complaint  Patient presents with  . bladder lesion    HPI: Valerie Massey is a 39 y.o. F who presents today for the evaluation and management of lesion on bladder.   She underwent a laparoscopic hyserectomy on 06/15/19 by Dr. Glennon Mac and had a cystoscopy at end of procedure where a bladder lesion was identified incidentally.  She denies blood in urine or bladder symptoms.   She reports of seeing a urologist in 2017 where she had a cysto done which was negative. She was told her bladder overactivity was attributed to stress and nothing was present in her bladder at that time.   Photographic scan under media reviewed indicating frondular on right posterior bladder wall adjacent but not involving left UO,  Appears to be less than 1 cm in estimation.  She is a smoker.  First cousin had bladder cancer who is 63 yo.   PMH: Past Medical History:  Diagnosis Date  . Anemia   . BRCA negative 2015   MyRisk neg  . Bronchitis   . Depression   . Family history of breast cancer 2015   IBIS=14.1%  . Family history of ovarian cancer   . GERD (gastroesophageal reflux disease)   . History of hiatal hernia   . Migraines     Surgical History: Past Surgical History:  Procedure Laterality Date  . CYSTOSCOPY N/A 06/15/2019   Procedure: CYSTOSCOPY;  Surgeon: Will Bonnet, MD;  Location: [facility];  Service: Gynecology;  Laterality: N/A;  . DILATATION & CURETTAGE/HYSTEROSCOPY WITH MYOSURE N/A 03/12/2018   Procedure: DILATATION & CURETTAGE/HYSTEROSCOPY WITH MYOSURE;  Surgeon: Will Bonnet, MD;  Location: [facility];  Service: Gynecology;  Laterality: N/A;  . DILATION AND CURETTAGE OF UTERUS    . PARTIAL HYSTERECTOMY    . TOTAL LAPAROSCOPIC HYSTERECTOMY WITH SALPINGECTOMY N/A 06/15/2019   Procedure: TOTAL  LAPAROSCOPIC HYSTERECTOMY WITH BILATERAL SALPINGECTOMY;  Surgeon: Will Bonnet, MD;  Location: [facility];  Service: Gynecology;  Laterality: N/A;  . WISDOM TOOTH EXTRACTION      Home Medications:  Allergies as of 07/14/2019      Reactions   Bee Venom Hives, Swelling   Other Nausea And Vomiting   scallops   Tamiflu [oseltamivir Phosphate] Hives      Medication List       Accurate as of July 14, 2019  3:00 PM. If you have any questions, ask your nurse or doctor.        STOP taking these medications   ibuprofen 600 MG tablet Commonly known as: ADVIL Stopped by: Hollice Espy, MD     TAKE these medications   diphenhydrAMINE 25 MG tablet Commonly known as: BENADRYL Take 2 tablets (50 mg total) by mouth every 6 (six) hours for 3 days. What changed:   when to take this  reasons to take this   fexofenadine 180 MG tablet Commonly known as: ALLEGRA Take 180 mg by mouth daily.   LORazepam 0.5 MG tablet Commonly known as: ATIVAN Take 1 tablet (0.5 mg total) by mouth every 12 (twelve) hours as needed for anxiety.   multivitamin with minerals Tabs tablet Take 1 tablet by mouth daily.   omeprazole 20 MG capsule Commonly known as: PRILOSEC Take 20 mg by mouth 2 (two) times daily.   ondansetron 4 MG disintegrating tablet Commonly known  as: Zofran ODT Take 1 tablet (4 mg total) by mouth every 8 (eight) hours as needed for nausea or vomiting.   oxyCODONE-acetaminophen 5-325 MG tablet Commonly known as: Percocet Take 1 tablet by mouth every 4 (four) hours as needed for severe pain.   sertraline 50 MG tablet Commonly known as: Zoloft Take 1 tablet (50 mg total) by mouth daily.   sodium chloride 0.65 % Soln nasal spray Commonly known as: OCEAN Place 1-2 sprays into both nostrils 4 (four) times daily as needed for congestion.       Allergies:  Allergies  Allergen Reactions  . Bee Venom Hives and Swelling  . Other Nausea And Vomiting    scallops  . Tamiflu  [Oseltamivir Phosphate] Hives    Family History: Family History  Problem Relation Age of Onset  . Breast cancer Mother 48  . Ovarian cancer Mother 11  . Heart attack Mother   . Endometrial cancer Maternal Grandmother 67  . Lung cancer Maternal Grandfather 60  . Lung cancer Paternal Grandfather 17  . Bladder Cancer Cousin     Social History:  reports that she has been smoking cigarettes. She started smoking about 22 years ago. She has been smoking about 0.50 packs per day. She has never used smokeless tobacco. She reports current alcohol use. She reports that she does not use drugs.   Physical Exam: BP 132/87   Pulse 92   Ht _0  (1.676 m)   Wt 230 lb (104.3 kg)   BMI 37.12 kg/m   Constitutional:  Alert and oriented, No acute distress. HEENT: [place] AT, moist mucus membranes.  Trachea midline, no masses. Cardiovascular: No clubbing, cyanosis, or edema. Respiratory: Normal respiratory effort, no increased work of breathing. Skin: No rashes, bruises or suspicious lesions. Neurologic: Grossly intact, no focal deficits, moving all 4 extremities. Psychiatric: Anxious, tearful at times  Laboratory Data: Lab Results  Component Value Date   WBC 7.9 06/11/2019   HGB 12.1 06/11/2019   HCT 37.9 06/11/2019   MCV 79.6 (L) 06/11/2019   PLT 336 06/11/2019    Lab Results  Component Value Date   CREATININE 0.70 06/11/2019   Assessment & Plan:    1. 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 Smoker Current smoker Discussed cessation and causative relationship   [facility] [address] [place], [place] [address] [phone]  I, Lucas Mallow, am acting as a scribe for Dr. Hollice Espy,  I have reviewed the above documentation for accuracy and completeness, and I agree with the above.   Hollice Espy, MD

## 2019-07-14 NOTE — Progress Notes (Signed)
 07/14/19 3:00 PM   Valerie Massey 02/22/1981 [id number]  Referring provider: [facility], [facility] [address] [place],  [place] [address]  Chief Complaint  Patient presents with  . bladder lesion    HPI: Valerie Massey is a 38 y.o. F who presents today for the evaluation and management of lesion on bladder.   She underwent a laparoscopic hyserectomy on 06/15/19 by Dr. Jackson and had a cystoscopy at end of procedure where a bladder lesion was identified incidentally.  She denies blood in urine or bladder symptoms.   She reports of seeing a urologist in 2017 where she had a cysto done which was negative. She was told her bladder overactivity was attributed to stress and nothing was present in her bladder at that time.   Photographic scan under media reviewed indicating frondular on right posterior bladder wall adjacent but not involving left UO,  Appears to be less than 1 cm in estimation.  She is a smoker.  First cousin had bladder cancer who is 48 yo.   PMH: Past Medical History:  Diagnosis Date  . Anemia   . BRCA negative 2015   MyRisk neg  . Bronchitis   . Depression   . Family history of breast cancer 2015   IBIS=14.1%  . Family history of ovarian cancer   . GERD (gastroesophageal reflux disease)   . History of hiatal hernia   . Migraines     Surgical History: Past Surgical History:  Procedure Laterality Date  . CYSTOSCOPY N/A 06/15/2019   Procedure: CYSTOSCOPY;  Surgeon: Jackson, Stephen D, MD;  Location: [facility];  Service: Gynecology;  Laterality: N/A;  . DILATATION & CURETTAGE/HYSTEROSCOPY WITH MYOSURE N/A 03/12/2018   Procedure: DILATATION & CURETTAGE/HYSTEROSCOPY WITH MYOSURE;  Surgeon: Jackson, Stephen D, MD;  Location: [facility];  Service: Gynecology;  Laterality: N/A;  . DILATION AND CURETTAGE OF UTERUS    . PARTIAL HYSTERECTOMY    . TOTAL LAPAROSCOPIC HYSTERECTOMY WITH SALPINGECTOMY N/A 06/15/2019   Procedure: TOTAL  LAPAROSCOPIC HYSTERECTOMY WITH BILATERAL SALPINGECTOMY;  Surgeon: Jackson, Stephen D, MD;  Location: [facility];  Service: Gynecology;  Laterality: N/A;  . WISDOM TOOTH EXTRACTION      Home Medications:  Allergies as of 07/14/2019      Reactions   Bee Venom Hives, Swelling   Other Nausea And Vomiting   scallops   Tamiflu [oseltamivir Phosphate] Hives      Medication List       Accurate as of July 14, 2019  3:00 PM. If you have any questions, ask your nurse or doctor.        STOP taking these medications   ibuprofen 600 MG tablet Commonly known as: ADVIL Stopped by: Raunel Dimartino, MD     TAKE these medications   diphenhydrAMINE 25 MG tablet Commonly known as: BENADRYL Take 2 tablets (50 mg total) by mouth every 6 (six) hours for 3 days. What changed:   when to take this  reasons to take this   fexofenadine 180 MG tablet Commonly known as: ALLEGRA Take 180 mg by mouth daily.   LORazepam 0.5 MG tablet Commonly known as: ATIVAN Take 1 tablet (0.5 mg total) by mouth every 12 (twelve) hours as needed for anxiety.   multivitamin with minerals Tabs tablet Take 1 tablet by mouth daily.   omeprazole 20 MG capsule Commonly known as: PRILOSEC Take 20 mg by mouth 2 (two) times daily.   ondansetron 4 MG disintegrating tablet Commonly known   as: Zofran ODT Take 1 tablet (4 mg total) by mouth every 8 (eight) hours as needed for nausea or vomiting.   oxyCODONE-acetaminophen 5-325 MG tablet Commonly known as: Percocet Take 1 tablet by mouth every 4 (four) hours as needed for severe pain.   sertraline 50 MG tablet Commonly known as: Zoloft Take 1 tablet (50 mg total) by mouth daily.   sodium chloride 0.65 % Soln nasal spray Commonly known as: OCEAN Place 1-2 sprays into both nostrils 4 (four) times daily as needed for congestion.       Allergies:  Allergies  Allergen Reactions  . Bee Venom Hives and Swelling  . Other Nausea And Vomiting    scallops  . Tamiflu  [Oseltamivir Phosphate] Hives    Family History: Family History  Problem Relation Age of Onset  . Breast cancer Mother 62  . Ovarian cancer Mother 50  . Heart attack Mother   . Endometrial cancer Maternal Grandmother 62  . Lung cancer Maternal Grandfather 60  . Lung cancer Paternal Grandfather 60  . Bladder Cancer Cousin     Social History:  reports that she has been smoking cigarettes. She started smoking about 22 years ago. She has been smoking about 0.50 packs per day. She has never used smokeless tobacco. She reports current alcohol use. She reports that she does not use drugs.   Physical Exam: BP 132/87   Pulse 92   Ht 5' 6" (1.676 m)   Wt 230 lb (104.3 kg)   BMI 37.12 kg/m   Constitutional:  Alert and oriented, No acute distress. HEENT: [place] AT, moist mucus membranes.  Trachea midline, no masses. Cardiovascular: No clubbing, cyanosis, or edema. Respiratory: Normal respiratory effort, no increased work of breathing. Skin: No rashes, bruises or suspicious lesions. Neurologic: Grossly intact, no focal deficits, moving all 4 extremities. Psychiatric: Anxious, tearful at times  Laboratory Data: Lab Results  Component Value Date   WBC 7.9 06/11/2019   HGB 12.1 06/11/2019   HCT 37.9 06/11/2019   MCV 79.6 (L) 06/11/2019   PLT 336 06/11/2019    Lab Results  Component Value Date   CREATININE 0.70 06/11/2019   Assessment & Plan:    1. 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 Smoker Current smoker Discussed cessation and causative relationship   [facility] [address], [address] [place], [place] [address] [phone]  I, Nethusan Sivanesan, am acting as a scribe for Dr. Keven Soucy,  I have reviewed the above documentation for accuracy and completeness, and I agree with the above.   Elric Tirado, MD    

## 2019-07-15 ENCOUNTER — Telehealth: Payer: Self-pay

## 2019-07-15 DIAGNOSIS — Z0289 Encounter for other administrative examinations: Secondary | ICD-10-CM

## 2019-07-15 LAB — URINALYSIS, COMPLETE
Bilirubin, UA: NEGATIVE
Glucose, UA: NEGATIVE
Ketones, UA: NEGATIVE
Leukocytes,UA: NEGATIVE
Nitrite, UA: NEGATIVE
Protein,UA: NEGATIVE
RBC, UA: NEGATIVE
Specific Gravity, UA: 1.025 (ref 1.005–1.030)
Urobilinogen, Ur: 0.2 mg/dL (ref 0.2–1.0)
pH, UA: 5 (ref 5.0–7.5)

## 2019-07-15 LAB — MICROSCOPIC EXAMINATION: RBC, Urine: NONE SEEN /hpf (ref 0–2)

## 2019-07-15 NOTE — Telephone Encounter (Signed)
Did you do this already?

## 2019-07-15 NOTE — Telephone Encounter (Signed)
Valerie Massey!

## 2019-07-15 NOTE — Telephone Encounter (Signed)
Please advise 

## 2019-07-15 NOTE — Telephone Encounter (Signed)
FMLA/DISABILITY form for [employer] filled out and given to KT for obtaining signature and processing.  Release to Return to Work to be filled out at last office visit.

## 2019-07-17 LAB — CULTURE, URINE COMPREHENSIVE

## 2019-07-19 ENCOUNTER — Ambulatory Visit (INDEPENDENT_AMBULATORY_CARE_PROVIDER_SITE_OTHER): Payer: BC Managed Care – PPO | Admitting: Obstetrics and Gynecology

## 2019-07-19 ENCOUNTER — Encounter: Payer: Self-pay | Admitting: Obstetrics and Gynecology

## 2019-07-19 ENCOUNTER — Other Ambulatory Visit: Payer: Self-pay

## 2019-07-19 VITALS — BP 124/74 | Ht 66.0 in | Wt 245.0 lb

## 2019-07-19 DIAGNOSIS — Z09 Encounter for follow-up examination after completed treatment for conditions other than malignant neoplasm: Secondary | ICD-10-CM

## 2019-07-19 DIAGNOSIS — Z48816 Encounter for surgical aftercare following surgery on the genitourinary system: Secondary | ICD-10-CM

## 2019-07-19 DIAGNOSIS — N921 Excessive and frequent menstruation with irregular cycle: Secondary | ICD-10-CM

## 2019-07-19 DIAGNOSIS — N840 Polyp of corpus uteri: Secondary | ICD-10-CM

## 2019-07-19 NOTE — Progress Notes (Signed)
   Postoperative Follow-up Patient presents post op from TLH/BS/Cysto 5 weeks ago for menorrhagia with irregular cycle, endometrial polypoid tissue.  Subjective: Patient reports marked improvement in her preop symptoms. Eating a regular diet without difficulty. The patient is not having any pain.  Activity: normal activities of daily living.  Objective: Vitals:   07/19/19 0904  BP: 124/74   Vital Signs: BP 124/74   Ht 5\' 6"  (1.676 m)   Wt 245 lb (111.1 kg)   BMI 39.54 kg/m  Constitutional: Well nourished, well developed female in no acute distress.  HEENT: normal Skin: Warm and dry.  Extremity: no edema  Abdomen: Soft, non-tender, normal bowel sounds; no bruits, organomegaly or masses. clean, dry and intact  Pelvic exam: (female chaperone present) is not limited by body habitus EGBUS: within normal limits Vagina: within normal limits and with vaginal cuff intact without erythema, induration, warmth, and tenderness  Assessment: 39 y.o. s/p TLH/BS/Cysto progressing well  Plan: Patient has done well after surgery with no apparent complications.  I have discussed the post-operative course to date, and the expected progress moving forward.  The patient understands what complications to be concerned about.  I will see the patient in routine follow up, or sooner if needed.    Activity plan: No restriction. Plan to undergo resection of lesion noted on cystoscopy incidentally during surgery. This will occur on Monday 3/22.    Prentice Docker, MD 07/19/2019, 9:08 AM

## 2019-07-21 ENCOUNTER — Other Ambulatory Visit: Payer: Self-pay

## 2019-07-21 ENCOUNTER — Encounter
Admission: RE | Admit: 2019-07-21 | Discharge: 2019-07-21 | Disposition: A | Discharge status: Home / Self Care | Payer: BC Managed Care – PPO | Source: Ambulatory Visit | Attending: Urology | Admitting: Urology

## 2019-07-21 HISTORY — DX: Fatty (change of) liver, not elsewhere classified: K76.0

## 2019-07-21 NOTE — Patient Instructions (Signed)
Your procedure is scheduled on: 07/26/19 Report to [facility]. To find out your arrival time please call [phone] between 1PM - 3PM on 07/23/19.  Remember: Instructions that are not followed completely may result in serious medical risk, up to and including death, or upon the discretion of your surgeon and anesthesiologist your surgery may need to be rescheduled.     _X__ 1. Do not eat food after midnight the night before your procedure.                 No gum chewing or hard candies. You may drink clear liquids up to 2 hours                 before you are scheduled to arrive for your surgery- DO not drink clear                 liquids within 2 hours of the start of your surgery.                 Clear Liquids include:  water, apple juice without pulp, clear carbohydrate                 drink such as Clearfast or Gatorade, Black Coffee or Tea (Do not add                 anything to coffee or tea). Diabetics water only  __X__2.  On the morning of surgery brush your teeth with toothpaste and water, you                 may rinse your mouth with mouthwash if you wish.  Do not swallow any              toothpaste of mouthwash.     _X__ 3.  No Alcohol for 24 hours before or after surgery.   _X__ 4.  Do Not Smoke or use e-cigarettes For 24 Hours Prior to Your Surgery.                 Do not use any chewable tobacco products for at least 6 hours prior to                 surgery.  ____  5.  Bring all medications with you on the day of surgery if instructed.   __X__  6.  Notify your doctor if there is any change in your medical condition      (cold, fever, infections).     Do not wear jewelry, make-up, hairpins, clips or nail polish. Do not wear lotions, powders, or perfumes.  Do not shave 48 hours prior to surgery. Men may shave face and neck. Do not bring valuables to the hospital.    [facility] is not responsible for any belongings or  valuables.  Contacts, dentures/partials or body piercings may not be worn into surgery. Bring a case for your contacts, glasses or hearing aids, a denture cup will be supplied. Leave your suitcase in the car. After surgery it may be brought to your room. For patients admitted to the hospital, discharge time is determined by your treatment team.   Patients discharged the day of surgery will not be allowed to drive home.   Please read over the following fact sheets that you were given:   MRSA Information  __X__ Take these medicines the morning of surgery with A SIP OF WATER:  1. fexofenadine (ALLEGRA) 180 MG tablet  2. omeprazole (PRILOSEC) 20 MG capsule  3. sertraline (ZOLOFT) 50 MG tablet  4. May take Ativan if needed  5.  6.  ____ Fleet Enema (as directed)   ____ Use CHG Soap/SAGE wipes as directed  ____ Use inhalers on the day of surgery  ____ Stop metformin/Janumet/Farxiga 2 days prior to surgery    ____ Take 1/2 of usual insulin dose the night before surgery. No insulin the morning          of surgery.   ____ Stop Blood Thinners Coumadin/Plavix/Xarelto/Pleta/Pradaxa/Eliquis/Effient/Aspirin  on   Or contact your Surgeon, Cardiologist or Medical Doctor regarding  ability to stop your blood thinners  __X__ Stop Anti-inflammatories 7 days before surgery such as Advil, Ibuprofen, Motrin,  BC or Goodies Powder, Naprosyn, Naproxen, Aleve, Aspirin    __X__ Stop all herbal supplements, fish oil or vitamin E until after surgery.    ____ Bring C-Pap to the hospital.

## 2019-07-22 ENCOUNTER — Other Ambulatory Visit
Admission: RE | Admit: 2019-07-22 | Discharge: 2019-07-22 | Disposition: A | Discharge status: Home / Self Care | Payer: BC Managed Care – PPO | Source: Ambulatory Visit | Attending: Urology | Admitting: Urology

## 2019-07-22 DIAGNOSIS — Z01812 Encounter for preprocedural laboratory examination: Secondary | ICD-10-CM | POA: Insufficient documentation

## 2019-07-22 DIAGNOSIS — Z20822 Contact with and (suspected) exposure to covid-19: Secondary | ICD-10-CM | POA: Diagnosis not present

## 2019-07-22 LAB — SARS CORONAVIRUS 2 (TAT 6-24 HRS): SARS Coronavirus 2: NEGATIVE

## 2019-07-26 ENCOUNTER — Ambulatory Visit
Admission: RE | Admit: 2019-07-26 | Discharge: 2019-07-26 | Disposition: A | Discharge status: Home / Self Care | Payer: BC Managed Care – PPO | Attending: Urology | Admitting: Urology

## 2019-07-26 ENCOUNTER — Other Ambulatory Visit: Payer: Self-pay

## 2019-07-26 ENCOUNTER — Encounter
Admission: RE | Disposition: A | Discharge status: Home / Self Care | Payer: Self-pay | Source: Home / Self Care | Attending: Urology

## 2019-07-26 ENCOUNTER — Ambulatory Visit: Payer: BC Managed Care – PPO | Admitting: Anesthesiology

## 2019-07-26 ENCOUNTER — Encounter: Payer: Self-pay | Admitting: Urology

## 2019-07-26 ENCOUNTER — Ambulatory Visit: Payer: BC Managed Care – PPO | Admitting: Obstetrics and Gynecology

## 2019-07-26 ENCOUNTER — Ambulatory Visit: Payer: BC Managed Care – PPO

## 2019-07-26 DIAGNOSIS — K449 Diaphragmatic hernia without obstruction or gangrene: Secondary | ICD-10-CM | POA: Insufficient documentation

## 2019-07-26 DIAGNOSIS — F17210 Nicotine dependence, cigarettes, uncomplicated: Secondary | ICD-10-CM | POA: Diagnosis not present

## 2019-07-26 DIAGNOSIS — F329 Major depressive disorder, single episode, unspecified: Secondary | ICD-10-CM | POA: Diagnosis not present

## 2019-07-26 DIAGNOSIS — N3289 Other specified disorders of bladder: Secondary | ICD-10-CM

## 2019-07-26 DIAGNOSIS — Z91030 Bee allergy status: Secondary | ICD-10-CM | POA: Insufficient documentation

## 2019-07-26 DIAGNOSIS — N3281 Overactive bladder: Secondary | ICD-10-CM | POA: Insufficient documentation

## 2019-07-26 DIAGNOSIS — Z91013 Allergy to seafood: Secondary | ICD-10-CM | POA: Insufficient documentation

## 2019-07-26 DIAGNOSIS — K219 Gastro-esophageal reflux disease without esophagitis: Secondary | ICD-10-CM | POA: Diagnosis not present

## 2019-07-26 DIAGNOSIS — Z90710 Acquired absence of both cervix and uterus: Secondary | ICD-10-CM | POA: Insufficient documentation

## 2019-07-26 DIAGNOSIS — Z887 Allergy status to serum and vaccine status: Secondary | ICD-10-CM | POA: Diagnosis not present

## 2019-07-26 DIAGNOSIS — N3080 Other cystitis without hematuria: Secondary | ICD-10-CM | POA: Diagnosis not present

## 2019-07-26 DIAGNOSIS — G43909 Migraine, unspecified, not intractable, without status migrainosus: Secondary | ICD-10-CM | POA: Insufficient documentation

## 2019-07-26 DIAGNOSIS — D494 Neoplasm of unspecified behavior of bladder: Secondary | ICD-10-CM | POA: Diagnosis not present

## 2019-07-26 HISTORY — PX: CYSTOSCOPY W/ RETROGRADES: SHX1426

## 2019-07-26 HISTORY — PX: TRANSURETHRAL RESECTION OF BLADDER TUMOR WITH MITOMYCIN-C: SHX6459

## 2019-07-26 SURGERY — TRANSURETHRAL RESECTION OF BLADDER TUMOR WITH MITOMYCIN-C
Anesthesia: General | Site: Ureter

## 2019-07-26 MED ORDER — FUROSEMIDE 10 MG/ML IJ SOLN
INTRAMUSCULAR | Status: AC
  Filled 2019-07-26: qty 4

## 2019-07-26 MED ORDER — ONDANSETRON HCL 4 MG/2ML IJ SOLN
INTRAMUSCULAR | Status: DC | PRN
  Administered 2019-07-26: 4 mg via INTRAVENOUS

## 2019-07-26 MED ORDER — LIDOCAINE HCL (CARDIAC) PF 100 MG/5ML IV SOSY
PREFILLED_SYRINGE | INTRAVENOUS | Status: DC | PRN
  Administered 2019-07-26: 60 mg via INTRAVENOUS

## 2019-07-26 MED ORDER — DEXMEDETOMIDINE HCL IN NACL 400 MCG/100ML IV SOLN
INTRAVENOUS | Status: DC | PRN
  Administered 2019-07-26: 8 ug via INTRAVENOUS

## 2019-07-26 MED ORDER — ONDANSETRON HCL 4 MG/2ML IJ SOLN: 4.0000 mg | Freq: Once | INTRAMUSCULAR | Status: DC | PRN

## 2019-07-26 MED ORDER — MIDAZOLAM HCL 2 MG/2ML IJ SOLN
INTRAMUSCULAR | Status: AC
  Filled 2019-07-26: qty 2

## 2019-07-26 MED ORDER — ONDANSETRON HCL 4 MG/2ML IJ SOLN
INTRAMUSCULAR | Status: AC
  Filled 2019-07-26: qty 2

## 2019-07-26 MED ORDER — PROPOFOL 10 MG/ML IV BOLUS
INTRAVENOUS | Status: AC
  Filled 2019-07-26: qty 20

## 2019-07-26 MED ORDER — CEFAZOLIN SODIUM-DEXTROSE 2-4 GM/100ML-% IV SOLN
INTRAVENOUS | Status: AC
  Filled 2019-07-26: qty 100

## 2019-07-26 MED ORDER — FENTANYL CITRATE (PF) 100 MCG/2ML IJ SOLN
INTRAMUSCULAR | Status: AC
  Filled 2019-07-26: qty 2

## 2019-07-26 MED ORDER — DEXAMETHASONE SODIUM PHOSPHATE 10 MG/ML IJ SOLN
INTRAMUSCULAR | Status: AC
  Filled 2019-07-26: qty 1

## 2019-07-26 MED ORDER — MIDAZOLAM HCL 2 MG/2ML IJ SOLN
INTRAMUSCULAR | Status: DC | PRN
  Administered 2019-07-26: 2 mg via INTRAVENOUS

## 2019-07-26 MED ORDER — FENTANYL CITRATE (PF) 100 MCG/2ML IJ SOLN
INTRAMUSCULAR | Status: AC
  Administered 2019-07-26: 25 ug via INTRAVENOUS
  Filled 2019-07-26: qty 2

## 2019-07-26 MED ORDER — LACTATED RINGERS IV SOLN: INTRAVENOUS | Status: DC

## 2019-07-26 MED ORDER — IOHEXOL 180 MG/ML  SOLN
INTRAMUSCULAR | Status: DC | PRN
  Administered 2019-07-26: 20 mL

## 2019-07-26 MED ORDER — GEMCITABINE CHEMO FOR BLADDER INSTILLATION 2000 MG
INTRAVENOUS | Status: DC | PRN
  Administered 2019-07-26: 2000 mg via INTRAVESICAL

## 2019-07-26 MED ORDER — OXYBUTYNIN CHLORIDE 5 MG PO TABS
5.0000 mg | ORAL_TABLET | Freq: Once | ORAL | Status: AC
  Filled 2019-07-26: qty 1

## 2019-07-26 MED ORDER — CEFAZOLIN SODIUM-DEXTROSE 2-4 GM/100ML-% IV SOLN
2.0000 g | INTRAVENOUS | Status: AC
  Administered 2019-07-26: 17:00:00 2 g via INTRAVENOUS

## 2019-07-26 MED ORDER — PROPOFOL 10 MG/ML IV BOLUS
INTRAVENOUS | Status: DC | PRN
  Administered 2019-07-26: 200 mg via INTRAVENOUS

## 2019-07-26 MED ORDER — DEXAMETHASONE SODIUM PHOSPHATE 10 MG/ML IJ SOLN
INTRAMUSCULAR | Status: DC | PRN
  Administered 2019-07-26: 5 mg via INTRAVENOUS

## 2019-07-26 MED ORDER — OXYBUTYNIN CHLORIDE 5 MG PO TABS: 5.0000 mg | ORAL_TABLET | Freq: Three times a day (TID) | ORAL | 0 refills | Status: AC | PRN

## 2019-07-26 MED ORDER — FENTANYL CITRATE (PF) 100 MCG/2ML IJ SOLN
25.0000 ug | INTRAMUSCULAR | Status: DC | PRN
  Administered 2019-07-26 (×3): 25 ug via INTRAVENOUS

## 2019-07-26 MED ORDER — OXYBUTYNIN CHLORIDE 5 MG PO TABS
ORAL_TABLET | ORAL | Status: AC
  Administered 2019-07-26: 5 mg via ORAL
  Filled 2019-07-26: qty 1

## 2019-07-26 MED ORDER — FENTANYL CITRATE (PF) 100 MCG/2ML IJ SOLN
INTRAMUSCULAR | Status: DC | PRN
  Administered 2019-07-26 (×2): 50 ug via INTRAVENOUS

## 2019-07-26 SURGICAL SUPPLY — 31 items
BAG DRAIN CYSTO-URO LG1000N (MISCELLANEOUS) ×4 IMPLANT
BAG URINE DRAIN 2000ML AR STRL (UROLOGICAL SUPPLIES) ×4 IMPLANT
BRUSH SCRUB EZ  4% CHG (MISCELLANEOUS) ×2
BRUSH SCRUB EZ 4% CHG (MISCELLANEOUS) ×2 IMPLANT
CATH FOLEY 2WAY  5CC 16FR (CATHETERS) ×2
CATH URETL 5X70 OPEN END (CATHETERS) ×4 IMPLANT
CATH URTH 16FR FL 2W BLN LF (CATHETERS) ×2 IMPLANT
CRADLE LAMINECT ARM (MISCELLANEOUS) ×4 IMPLANT
DRAPE UTILITY 15X26 TOWEL STRL (DRAPES) ×4 IMPLANT
DRSG TELFA 4X3 1S NADH ST (GAUZE/BANDAGES/DRESSINGS) ×4 IMPLANT
ELECT LOOP 22F BIPOLAR SML (ELECTROSURGICAL)
ELECT REM PT RETURN 9FT ADLT (ELECTROSURGICAL)
ELECTRODE LOOP 22F BIPOLAR SML (ELECTROSURGICAL) IMPLANT
ELECTRODE REM PT RTRN 9FT ADLT (ELECTROSURGICAL) IMPLANT
GLOVE BIO SURGEON STRL SZ 6.5 (GLOVE) ×3 IMPLANT
GLOVE BIO SURGEONS STRL SZ 6.5 (GLOVE) ×1
GOWN STRL REUS W/ TWL LRG LVL3 (GOWN DISPOSABLE) ×4 IMPLANT
GOWN STRL REUS W/TWL LRG LVL3 (GOWN DISPOSABLE) ×4
GUIDEWIRE STR DUAL SENSOR (WIRE) ×4 IMPLANT
KIT TURNOVER CYSTO (KITS) ×4 IMPLANT
LOOP CUT BIPOLAR 24F LRG (ELECTROSURGICAL) IMPLANT
NDL SAFETY ECLIPSE 18X1.5 (NEEDLE) ×2 IMPLANT
NEEDLE HYPO 18GX1.5 SHARP (NEEDLE) ×2
PACK CYSTO AR (MISCELLANEOUS) ×4 IMPLANT
SET CYSTO W/LG BORE CLAMP LF (SET/KITS/TRAYS/PACK) ×4 IMPLANT
SET IRRIG Y TYPE TUR BLADDER L (SET/KITS/TRAYS/PACK) ×4 IMPLANT
SOL .9 NS 3000ML IRR  AL (IV SOLUTION) ×2
SOL .9 NS 3000ML IRR UROMATIC (IV SOLUTION) ×2 IMPLANT
SURGILUBE 2OZ TUBE FLIPTOP (MISCELLANEOUS) ×4 IMPLANT
SYRINGE IRR TOOMEY STRL 70CC (SYRINGE) ×4 IMPLANT
WATER STERILE IRR 1000ML POUR (IV SOLUTION) ×4 IMPLANT

## 2019-07-26 NOTE — Discharge Instructions (Signed)
Transurethral Resection of Bladder Tumor (TURBT) or Bladder Biopsy ° ° °Definition: ° Transurethral Resection of the Bladder Tumor is a surgical procedure used to diagnose and remove tumors within the bladder. TURBT is the most common treatment for early stage bladder cancer. ° °General instructions: °   ° Your recent bladder surgery requires very little post hospital care but some definite precautions. ° °Despite the fact that no skin incisions were used, the area around the bladder incisions are raw and covered with scabs to promote healing and prevent bleeding. Certain precautions are needed to insure that the scabs are not disturbed over the next 2-4 weeks while the healing proceeds. ° °Because the raw surface inside your bladder and the irritating effects of urine you may expect frequency of urination and/or urgency (a stronger desire to urinate) and perhaps even getting up at night more often. This will usually resolve or improve slowly over the healing period. You may see some blood in your urine over the first 6 weeks. Do not be alarmed, even if the urine was clear for a while. Get off your feet and drink lots of fluids until clearing occurs. If you start to pass clots or don't improve call us. ° °Diet: ° °You may return to your normal diet immediately. Because of the raw surface of your bladder, alcohol, spicy foods, foods high in acid and drinks with caffeine may cause irritation or frequency and should be used in moderation. To keep your urine flowing freely and avoid constipation, drink plenty of fluids during the day (8-10 glasses). Tip: Avoid cranberry juice because it is very acidic. ° °Activity: ° °Your physical activity doesn't need to be restricted. However, if you are very active, you may see some blood in the urine. We suggest that you reduce your activity under the circumstances until the bleeding has stopped. ° °Bowels: ° °It is important to keep your bowels regular during the postoperative  period. Straining with bowel movements can cause bleeding. A bowel movement every other day is reasonable. Use a mild laxative if needed, such as milk of magnesia 2-3 tablespoons, or 2 Dulcolax tablets. Call if you continue to have problems. If you had been taking narcotics for pain, before, during or after your surgery, you may be constipated. Take a laxative if necessary. ° ° ° °Medication: ° °You should resume your pre-surgery medications unless told not to. In addition you may be given an antibiotic to prevent or treat infection. Antibiotics are not always necessary. All medication should be taken as prescribed until the bottles are finished unless you are having an unusual reaction to one of the drugs. ° ° °[facility] °[place], [place] [address] °[phone] ° ° ° ° °

## 2019-07-26 NOTE — Interval H&P Note (Signed)
History and Physical Interval Note:  07/26/2019 4:09 PM  Valerie Massey  has presented today for surgery, with the diagnosis of bladder mass.  The various methods of treatment have been discussed with the patient and family. After consideration of risks, benefits and other options for treatment, the patient has consented to  Procedure(s): TRANSURETHRAL RESECTION OF BLADDER TUMOR WITH gemcitabine (N/A) CYSTOSCOPY WITH RETROGRADE PYELOGRAM (Bilateral) as a surgical intervention.  The patient's history has been reviewed, patient examined, no change in status, stable for surgery.  I have reviewed the patient's chart and labs.  Questions were answered to the patient's satisfaction.    RRR CTAB  Hollice Espy

## 2019-07-26 NOTE — Anesthesia Postprocedure Evaluation (Signed)
Anesthesia Post Note  Patient: Valerie Massey  Procedure(s) Performed: TRANSURETHRAL RESECTION OF BLADDER TUMOR WITH gemcitabine (N/A Bladder) CYSTOSCOPY WITH RETROGRADE PYELOGRAM (Bilateral Ureter)  Patient location during evaluation: PACU Anesthesia Type: General Level of consciousness: awake and alert Pain management: pain level controlled Vital Signs Assessment: post-procedure vital signs reviewed and stable Respiratory status: spontaneous breathing, nonlabored ventilation, respiratory function stable and patient connected to nasal cannula oxygen Cardiovascular status: blood pressure returned to baseline and stable Postop Assessment: no apparent nausea or vomiting Anesthetic complications: no     Last Vitals:  Vitals:   07/26/19 1730 07/26/19 1810  BP: (!) 135/95 127/78  Pulse: 85 62  Resp: 13 12  Temp:    SpO2: 96% 96%    Last Pain:  Vitals:   07/26/19 1810  TempSrc:   PainSc: 2                  [name] [name]

## 2019-07-26 NOTE — Anesthesia Procedure Notes (Signed)
Procedure Name: LMA Insertion Date/Time: 07/26/2019 4:35 PM Performed by: Jerrye Noble, CRNA Pre-anesthesia Checklist: Patient identified, Emergency Drugs available, Suction available and Patient being monitored Patient Re-evaluated:Patient Re-evaluated prior to induction Oxygen Delivery Method: Circle system utilized Preoxygenation: Pre-oxygenation with 100% oxygen Induction Type: IV induction Ventilation: Mask ventilation without difficulty LMA: LMA inserted LMA Size: 4.5 Number of attempts: 1 Placement Confirmation: positive ETCO2 Tube secured with: Tape Dental Injury: Teeth and Oropharynx as per pre-operative assessment

## 2019-07-26 NOTE — Op Note (Signed)
Date of procedure: 07/26/19  Preoperative diagnosis:  1. Bladder mass   Postoperative diagnosis:  1. Same as above   Procedure: 1. TURBT, small 2. Bilateral retrograde pyelogram 3. Instillation of intravesical chemotherapy  Surgeon: Hollice Espy, MD  Anesthesia: General  Complications: None  Intraoperative findings: Normal upper tracts bilaterally.  Small approximately 3 mm pedunculated lesion on the right lateral bladder wall resected via cold cup.  Chemo instilled postop.  EBL: Minimal  Specimens: Bladder tumor, right lateral bladder wall  Drains: 16 French Foley catheter  Indication: Valerie Massey is a 39 y.o. patient with an incidental bladder lesion identified on cystoscopy at the time of hysterectomy.  After reviewing the management options for treatment, she elected to proceed with the above surgical procedure(s). We have discussed the potential benefits and risks of the procedure, side effects of the proposed treatment, the likelihood of the patient achieving the goals of the procedure, and any potential problems that might occur during the procedure or recuperation. Informed consent has been obtained.  Description of procedure:  The patient was taken to the operating room and general anesthesia was induced.  The patient was placed in the dorsal lithotomy position, prepped and draped in the usual sterile fashion, and preoperative antibiotics were administered. A preoperative time-out was performed.   A 21 French scope was advanced per urethra into the bladder.  The bladder is carefully inspected and only 1 solitary lesion was identified, approximately 3 mm which had a pedunculated superficial and low-grade type appearance.  It was on the right lateral bladder wall proximally 1 cm away from the right UO.  There are no other bladder tumors lesions or stones.  Attention was first turned to the right ureteral orifice which was cannulated using a 5 [place] open-ended  ureteral catheter.  A gentle retrograde pyelogram was performed which showed no filling defects or hydroureteronephrosis.  The same exact procedure was from the left and this upper tract was also normal.  These were saved to PACS.  Next, cold cup biopsy forceps were used to resect the lesion en bloc.  This was passed off the field.  This was a relatively superficial to bite but this seemed appropriate based on the visual appearance of the tumor.  Bugbee electrocautery was then used to fulgurate the base.  Hemostasis was excellent.  The bladder was drained.  The scope was then removed.  A 16 French Foley catheter was then inserted [place] with 10 cc of sterile water.  The patient was cleaned and dried, repositioned supine position, reversed myesthesia, taken back in stable condition.  2000 mg of intravesical gemcitabine was instilled to the bladder.  This was allowed to dwell in the PACU for 1 hour.  This was well-tolerated.  After 1 hour, the catheter was drained and removed.  Plan: Plan on calling her with her pathology results.  Hollice Espy, M.D.

## 2019-07-26 NOTE — Transfer of Care (Signed)
Immediate Anesthesia Transfer of Care Note  Patient: Valerie Massey  Procedure(s) Performed: TRANSURETHRAL RESECTION OF BLADDER TUMOR WITH gemcitabine (N/A Bladder) CYSTOSCOPY WITH RETROGRADE PYELOGRAM (Bilateral Ureter)  Patient Location: PACU  Anesthesia Type:General  Level of Consciousness: drowsy  Airway & Oxygen Therapy: Patient Spontanous Breathing and Patient connected to face mask oxygen  Post-op Assessment: Report given to RN and Post -op Vital signs reviewed and stable  Post vital signs: Reviewed and stable  Last Vitals:  Vitals Value Taken Time  BP 97/58 07/26/19 1700  Temp    Pulse 73 07/26/19 1701  Resp 8 07/26/19 1701  SpO2 97 % 07/26/19 1701  Vitals shown include unvalidated device data.  Last Pain:  Vitals:   07/26/19 1429  TempSrc:   PainSc: 0-No pain         Complications: No apparent anesthesia complications

## 2019-07-26 NOTE — Anesthesia Preprocedure Evaluation (Signed)
Anesthesia Evaluation  Patient identified by MRN, date of birth, ID band Patient awake    Reviewed: Allergy & Precautions, NPO status , Patient's Chart, lab work & pertinent test results  History of Anesthesia Complications Negative for: history of anesthetic complications  Airway Mallampati: II       Dental   Pulmonary neg sleep apnea, neg COPD, Current Smoker and Patient abstained from smoking.,           Cardiovascular (-) hypertension(-) Past MI and (-) CHF (-) dysrhythmias (-) Valvular Problems/Murmurs     Neuro/Psych neg Seizures Depression    GI/Hepatic Neg liver ROS, hiatal hernia, GERD  Medicated and Controlled,  Endo/Other  neg diabetes  Renal/GU negative Renal ROS     Musculoskeletal   Abdominal   Peds  Hematology  (+) anemia ,   Anesthesia Other Findings   Reproductive/Obstetrics                             Anesthesia Physical Anesthesia Plan  ASA: II  Anesthesia Plan: General   Post-op Pain Management:    Induction: Intravenous  PONV Risk Score and Plan: 2 and Ondansetron and Dexamethasone  Airway Management Planned: LMA  Additional Equipment:   Intra-op Plan:   Post-operative Plan:   Informed Consent: I have reviewed the patients History and Physical, chart, labs and discussed the procedure including the risks, benefits and alternatives for the proposed anesthesia with the patient or authorized representative who has indicated his/her understanding and acceptance.       Plan Discussed with:   Anesthesia Plan Comments:         Anesthesia Quick Evaluation

## 2019-07-27 ENCOUNTER — Ambulatory Visit
Admission: RE | Admit: 2019-07-27 | Discharge: 2019-07-27 | Disposition: A | Discharge status: Home / Self Care | Payer: BC Managed Care – PPO | Source: Ambulatory Visit | Attending: Urology | Admitting: Urology

## 2019-07-27 DIAGNOSIS — N329 Bladder disorder, unspecified: Secondary | ICD-10-CM | POA: Diagnosis not present

## 2019-07-27 DIAGNOSIS — N3289 Other specified disorders of bladder: Secondary | ICD-10-CM | POA: Diagnosis not present

## 2019-07-27 NOTE — Telephone Encounter (Signed)
Pt is calling triage in regards to the mychart message she sent earlier.

## 2019-07-28 ENCOUNTER — Telehealth: Payer: Self-pay | Admitting: Urology

## 2019-07-28 LAB — SURGICAL PATHOLOGY

## 2019-07-28 NOTE — Telephone Encounter (Signed)
Pathology is BENIGN!!!  Called with results, discussed in detail.  All questions answered.  Recommend cysto in 1 year given fairly rare pathology and smoking.  She is agreeable with this plan.  Return sooner if develops urinary symptoms or blood.    Please schedule 1 year soon.    Hollice Espy, MD

## 2019-07-29 ENCOUNTER — Other Ambulatory Visit: Payer: Self-pay

## 2019-07-29 DIAGNOSIS — F329 Major depressive disorder, single episode, unspecified: Secondary | ICD-10-CM

## 2019-07-29 DIAGNOSIS — Z01419 Encounter for gynecological examination (general) (routine) without abnormal findings: Secondary | ICD-10-CM

## 2019-07-29 DIAGNOSIS — F419 Anxiety disorder, unspecified: Secondary | ICD-10-CM

## 2019-07-30 ENCOUNTER — Other Ambulatory Visit: Payer: Self-pay | Admitting: Obstetrics and Gynecology

## 2019-07-30 DIAGNOSIS — F419 Anxiety disorder, unspecified: Secondary | ICD-10-CM

## 2019-07-30 DIAGNOSIS — F329 Major depressive disorder, single episode, unspecified: Secondary | ICD-10-CM

## 2019-07-30 DIAGNOSIS — Z01419 Encounter for gynecological examination (general) (routine) without abnormal findings: Secondary | ICD-10-CM

## 2019-07-30 MED ORDER — LORAZEPAM 0.5 MG PO TABS: 0.5000 mg | ORAL_TABLET | Freq: Two times a day (BID) | ORAL | 0 refills | Status: DC | PRN

## 2019-07-30 MED ORDER — SERTRALINE HCL 50 MG PO TABS: 50.0000 mg | ORAL_TABLET | Freq: Every day | ORAL | 2 refills | Status: DC

## 2019-07-30 NOTE — Telephone Encounter (Signed)
Advise

## 2019-10-05 ENCOUNTER — Telehealth: Payer: Self-pay | Admitting: Internal Medicine

## 2019-10-05 NOTE — Telephone Encounter (Signed)
Lm on vm to have patient call and make a new patient appointment with Dr. Derrel Nip.

## 2019-10-18 ENCOUNTER — Encounter: Payer: Self-pay | Admitting: Obstetrics and Gynecology

## 2019-10-18 ENCOUNTER — Other Ambulatory Visit: Payer: Self-pay

## 2019-10-18 ENCOUNTER — Ambulatory Visit (INDEPENDENT_AMBULATORY_CARE_PROVIDER_SITE_OTHER): Payer: BC Managed Care – PPO | Admitting: Obstetrics and Gynecology

## 2019-10-18 VITALS — BP 138/80 | Ht 66.0 in | Wt 248.0 lb

## 2019-10-18 DIAGNOSIS — F329 Major depressive disorder, single episode, unspecified: Secondary | ICD-10-CM

## 2019-10-18 DIAGNOSIS — F419 Anxiety disorder, unspecified: Secondary | ICD-10-CM | POA: Diagnosis not present

## 2019-10-18 MED ORDER — LORAZEPAM 0.5 MG PO TABS: 0.5000 mg | ORAL_TABLET | Freq: Two times a day (BID) | ORAL | 0 refills | Status: DC | PRN

## 2019-10-18 MED ORDER — SERTRALINE HCL 100 MG PO TABS: 100.0000 mg | ORAL_TABLET | Freq: Every day | ORAL | 1 refills | Status: DC

## 2019-10-18 NOTE — Progress Notes (Signed)
Obstetrics & Gynecology Office Visit   Chief Complaint  Patient presents with  . Follow-up    medication   History of Present Illness: 39 y.o. G0P0000 female who is taking zoloft 50 mg daily for anxiety and depression.  Her PHQ9 is 6 and GAD7 is 6. She thinks things are stressful and OK overall.  She takes lorazepam once or twice a week.  She has not had a refill since March (3 months). She denies SI/HI.  She denies side effects of the medication.    She does notice a sharp pain from her umbilicus down to her pubic area.  This usually goes away.      Past Medical History:  Diagnosis Date  . Anemia   . BRCA negative 2015   MyRisk neg  . Bronchitis   . Depression   . Family history of breast cancer 2015   IBIS=14.1%  . Family history of ovarian cancer   . Fatty liver   . GERD (gastroesophageal reflux disease)   . History of hiatal hernia   . Migraines     Past Surgical History:  Procedure Laterality Date  . ABDOMINAL HYSTERECTOMY    . CYSTOSCOPY N/A 06/15/2019   Procedure: CYSTOSCOPY;  Surgeon: Will Bonnet, MD;  Location: [facility];  Service: Gynecology;  Laterality: N/A;  . CYSTOSCOPY W/ RETROGRADES Bilateral 07/26/2019   Procedure: CYSTOSCOPY WITH RETROGRADE PYELOGRAM;  Surgeon: Hollice Espy, MD;  Location: [facility];  Service: Urology;  Laterality: Bilateral;  . DILATATION & CURETTAGE/HYSTEROSCOPY WITH MYOSURE N/A 03/12/2018   Procedure: DILATATION & CURETTAGE/HYSTEROSCOPY WITH MYOSURE;  Surgeon: Will Bonnet, MD;  Location: [facility];  Service: Gynecology;  Laterality: N/A;  . DILATION AND CURETTAGE OF UTERUS    . PARTIAL HYSTERECTOMY    . TOTAL LAPAROSCOPIC HYSTERECTOMY WITH SALPINGECTOMY N/A 06/15/2019   Procedure: TOTAL LAPAROSCOPIC HYSTERECTOMY WITH BILATERAL SALPINGECTOMY;  Surgeon: Will Bonnet, MD;  Location: [facility];  Service: Gynecology;  Laterality: N/A;  . TRANSURETHRAL RESECTION OF BLADDER TUMOR WITH MITOMYCIN-C N/A 07/26/2019   Procedure:  TRANSURETHRAL RESECTION OF BLADDER TUMOR WITH gemcitabine;  Surgeon: Hollice Espy, MD;  Location: [facility];  Service: Urology;  Laterality: N/A;  . WISDOM TOOTH EXTRACTION      Gynecologic History: Patient's last menstrual period was 03/13/2019.  Obstetric History: G0P0000  Family History  Problem Relation Age of Onset  . Breast cancer Mother 78  . Ovarian cancer Mother 14  . Heart attack Mother   . Endometrial cancer Maternal Grandmother 72  . Lung cancer Maternal Grandfather 60  . Lung cancer Paternal Grandfather 6  . Bladder Cancer Cousin     Social History   Socioeconomic History  . Marital status: Significant Other    Spouse name: [place]  . Number of children: Not on file  . Years of education: Not on file  . Highest education level: Not on file  Occupational History  . Occupation: Scientist, water quality  Tobacco Use  . Smoking status: Current Every Day Smoker    Packs/day: 0.50    Types: Cigarettes    Start date: 05/06/1997  . Smokeless tobacco: Never Used  Vaping Use  . Vaping Use: Never used  Substance and Sexual Activity  . Alcohol use: Yes    Comment: 1 q month  . Drug use: Never  . Sexual activity: Yes    Partners: Male    Birth control/protection: Surgical    Comment: Hysterectomy  Other Topics Concern  . Not on file  Social History Narrative  .  Not on file   Social Determinants of Health   Financial Resource Strain:   . Difficulty of Paying Living Expenses:   Food Insecurity:   . Worried About Charity fundraiser in the Last Year:   . Arboriculturist in the Last Year:   Transportation Needs:   . Film/video editor (Medical):   Marland Kitchen Lack of Transportation (Non-Medical):   Physical Activity:   . Days of Exercise per Week:   . Minutes of Exercise per Session:   Stress:   . Feeling of Stress :   Social Connections:   . Frequency of Communication with Friends and Family:   . Frequency of Social Gatherings with Friends and Family:   . Attends Religious  Services:   . Active Member of Clubs or Organizations:   . Attends Archivist Meetings:   Marland Kitchen Marital Status:   Intimate Partner Violence:   . Fear of Current or Ex-Partner:   . Emotionally Abused:   Marland Kitchen Physically Abused:   . Sexually Abused:     Allergies  Allergen Reactions  . Bee Venom Hives and Swelling  . Other Nausea And Vomiting    scallops  . Tamiflu [Oseltamivir Phosphate] Hives    Prior to Admission medications   Medication Sig Start Date End Date Taking? Authorizing Provider  acetaminophen (TYLENOL) 500 MG tablet Take 500 mg by mouth every 6 (six) hours as needed (for pain.).   Yes [provider]  diphenhydrAMINE (BENADRYL) 25 MG tablet Take 2 tablets (50 mg total) by mouth every 6 (six) hours for 3 days. Patient taking differently: Take 50 mg by mouth every 6 (six) hours as needed for allergies.  04/30/18 05/27/20 Yes Carrie Mew, MD  fexofenadine (ALLEGRA) 180 MG tablet Take 180 mg by mouth daily.   Yes [provider]  LORazepam (ATIVAN) 0.5 MG tablet Take 1 tablet (0.5 mg total) by mouth every 12 (twelve) hours as needed for anxiety. 07/30/19  Yes Will Bonnet, MD  Multiple Vitamin (MULTIVITAMIN WITH MINERALS) TABS tablet Take 1 tablet by mouth daily.    Yes [provider]  omeprazole (PRILOSEC) 20 MG capsule Take 20 mg by mouth 2 (two) times daily.   Yes [provider]  sertraline (ZOLOFT) 50 MG tablet Take 1 tablet (50 mg total) by mouth daily. 07/30/19  Yes Will Bonnet, MD  sodium chloride (OCEAN) 0.65 % SOLN nasal spray Place 1-2 sprays into both nostrils 4 (four) times daily as needed for congestion.   Yes [provider]  oxybutynin (DITROPAN) 5 MG tablet Take 1 tablet (5 mg total) by mouth every 8 (eight) hours as needed for bladder spasms. Patient not taking: Reported on 10/18/2019 07/26/19   Hollice Espy, MD    Review of Systems  Constitutional: Negative.   HENT: Negative.   Eyes:  Negative.   Respiratory: Negative.   Cardiovascular: Negative.   Gastrointestinal: Negative.   Genitourinary: Negative.   Musculoskeletal: Negative.   Skin: Negative.   Neurological: Negative.   Psychiatric/Behavioral: Positive for depression. Negative for hallucinations, memory loss, substance abuse and suicidal ideas. The patient is nervous/anxious. The patient does not have insomnia.      Physical Exam BP 138/80   Ht '5\' 6"'$  (1.676 m)   Wt 248 lb (112.5 kg)   LMP 03/13/2019   BMI 40.03 kg/m  Patient's last menstrual period was 03/13/2019. Physical Exam Constitutional:      General: She is not in acute distress.  Appearance: Normal appearance.  HENT:     Head: Normocephalic and atraumatic.  Eyes:     General: No scleral icterus.    Conjunctiva/sclera: Conjunctivae normal.  Neurological:     General: No focal deficit present.     Mental Status: She is alert and oriented to person, place, and time.     Cranial Nerves: No cranial nerve deficit.  Psychiatric:        Mood and Affect: Mood normal.        Behavior: Behavior normal.        Judgment: Judgment normal.    PHQ9: 6 GAD7: 6  Assessment: 39 y.o. G0P0000 female here for  1. Anxiety and depression      Plan: Problem List Items Addressed This Visit    None    Visit Diagnoses    Anxiety and depression    -  Primary   Relevant Medications   sertraline (ZOLOFT) 100 MG tablet   LORazepam (ATIVAN) 0.5 MG tablet     Follow up in October/November for medication follow up and annual.    A total of 20 minutes were spent face-to-face with the patient as well as preparation, review, communication, and documentation during this encounter.    Prentice Docker, MD 10/18/2019 11:11 AM

## 2020-01-31 ENCOUNTER — Other Ambulatory Visit: Payer: Self-pay | Admitting: Obstetrics and Gynecology

## 2020-01-31 DIAGNOSIS — F419 Anxiety disorder, unspecified: Secondary | ICD-10-CM

## 2020-01-31 MED ORDER — LORAZEPAM 0.5 MG PO TABS: 0.5000 mg | ORAL_TABLET | Freq: Two times a day (BID) | ORAL | 0 refills | Status: AC | PRN

## 2020-03-21 ENCOUNTER — Other Ambulatory Visit: Payer: Self-pay

## 2020-03-21 ENCOUNTER — Ambulatory Visit (INDEPENDENT_AMBULATORY_CARE_PROVIDER_SITE_OTHER): Payer: BC Managed Care – PPO | Admitting: Obstetrics and Gynecology

## 2020-03-21 ENCOUNTER — Encounter: Payer: Self-pay | Admitting: Obstetrics and Gynecology

## 2020-03-21 VITALS — BP 122/78 | Ht 66.0 in | Wt 236.0 lb

## 2020-03-21 DIAGNOSIS — Z1339 Encounter for screening examination for other mental health and behavioral disorders: Secondary | ICD-10-CM

## 2020-03-21 DIAGNOSIS — Z1331 Encounter for screening for depression: Secondary | ICD-10-CM

## 2020-03-21 DIAGNOSIS — Z01419 Encounter for gynecological examination (general) (routine) without abnormal findings: Secondary | ICD-10-CM | POA: Diagnosis not present

## 2020-03-21 NOTE — Progress Notes (Signed)
Gynecology Annual Exam  PCP: [facility]  Chief Complaint  Patient presents with  . Annual Exam   History of Present Illness:  Ms. Valerie Massey is a 39 y.o. G0P0000 who LMP was Patient's last menstrual period was 03/13/2019., presents today for her annual examination.  Her menses are absent due to hysterectomy.  She is sexually active.  No issues with intercourse.    Last Pap: 02/2019 - neg/HPV neg Hx of STDs: none  Tested negative on MyRisk. The patient does do self-breast exams.  Tobacco use: 1/2 ppd, not ready to quit Alcohol use: social drinker Exercise: walks a lot at work  The patient wears seatbelts: yes.   The patient reports that domestic violence in her life is absent.   She is doing better from an anxiety/depression.  She is doing well at this moment.  Past Medical History:  Diagnosis Date  . Anemia   . BRCA negative 2015   MyRisk neg  . Bronchitis   . Depression   . Family history of breast cancer 2015   IBIS=14.1%  . Family history of ovarian cancer   . Fatty liver   . GERD (gastroesophageal reflux disease)   . History of hiatal hernia   . Migraines     Past Surgical History:  Procedure Laterality Date  . CYSTOSCOPY N/A 06/15/2019   Procedure: CYSTOSCOPY;  Surgeon: Jackson, Stephen D, MD;  Location: [facility];  Service: Gynecology;  Laterality: N/A;  . CYSTOSCOPY W/ RETROGRADES Bilateral 07/26/2019   Procedure: CYSTOSCOPY WITH RETROGRADE PYELOGRAM;  Surgeon: Brandon, Ashley, MD;  Location: [facility];  Service: Urology;  Laterality: Bilateral;  . DILATATION & CURETTAGE/HYSTEROSCOPY WITH MYOSURE N/A 03/12/2018   Procedure: DILATATION & CURETTAGE/HYSTEROSCOPY WITH MYOSURE;  Surgeon: Jackson, Stephen D, MD;  Location: [facility];  Service: Gynecology;  Laterality: N/A;  . DILATION AND CURETTAGE OF UTERUS    . PARTIAL HYSTERECTOMY    . TOTAL LAPAROSCOPIC HYSTERECTOMY WITH SALPINGECTOMY N/A 06/15/2019   Procedure: TOTAL LAPAROSCOPIC  HYSTERECTOMY WITH BILATERAL SALPINGECTOMY;  Surgeon: Jackson, Stephen D, MD;  Location: [facility];  Service: Gynecology;  Laterality: N/A;  . TRANSURETHRAL RESECTION OF BLADDER TUMOR WITH MITOMYCIN-C N/A 07/26/2019   Procedure: TRANSURETHRAL RESECTION OF BLADDER TUMOR WITH gemcitabine;  Surgeon: Brandon, Ashley, MD;  Location: [facility];  Service: Urology;  Laterality: N/A;  . WISDOM TOOTH EXTRACTION      Prior to Admission medications   Medication Sig Start Date End Date Taking? Authorizing Provider  acetaminophen (TYLENOL) 500 MG tablet Take 500 mg by mouth every 6 (six) hours as needed (for pain.).   Yes [provider]  albuterol (VENTOLIN HFA) 108 (90 Base) MCG/ACT inhaler Inhale 2 puffs into the lungs every 6 (six) hours as needed. 10/21/19  Yes [provider]  lisinopril (ZESTRIL) 5 MG tablet Take by mouth. 12/30/19 12/29/20 Yes [provider]  LORazepam (ATIVAN) 0.5 MG tablet Take 1 tablet (0.5 mg total) by mouth every 12 (twelve) hours as needed for anxiety. 01/31/20  Yes Jackson, Stephen D, MD  Multiple Vitamin (MULTIVITAMIN WITH MINERALS) TABS tablet Take 1 tablet by mouth daily.    Yes [provider]  omeprazole (PRILOSEC) 20 MG capsule Take 20 mg by mouth 2 (two) times daily.   Yes [provider]  sertraline (ZOLOFT) 100 MG tablet Take 1 tablet (100 mg total) by mouth daily. 10/18/19  Yes Jackson, Stephen D, MD    Allergies  Allergen Reactions  . Bee Venom Hives and   Swelling  . Other Nausea And Vomiting    scallops  . Tamiflu [Oseltamivir Phosphate] Hives   Obstetric History: G0P0000  Social History   Socioeconomic History  . Marital status: Significant Other    Spouse name: jordan  . Number of children: Not on file  . Years of education: Not on file  . Highest education level: Not on file  Occupational History  . Occupation: Cashier  Tobacco Use  . Smoking status: Current Every Day Smoker    Packs/day: 0.50    Types:  Cigarettes    Start date: 05/06/1997  . Smokeless tobacco: Never Used  Vaping Use  . Vaping Use: Never used  Substance and Sexual Activity  . Alcohol use: Yes    Comment: 1 q month  . Drug use: Never  . Sexual activity: Yes    Partners: Male    Birth control/protection: Surgical    Comment: Hysterectomy  Other Topics Concern  . Not on file  Social History Narrative  . Not on file   Social Determinants of Health   Financial Resource Strain:   . Difficulty of Paying Living Expenses: Not on file  Food Insecurity:   . Worried About Running Out of Food in the Last Year: Not on file  . Ran Out of Food in the Last Year: Not on file  Transportation Needs:   . Lack of Transportation (Medical): Not on file  . Lack of Transportation (Non-Medical): Not on file  Physical Activity:   . Days of Exercise per Week: Not on file  . Minutes of Exercise per Session: Not on file  Stress:   . Feeling of Stress : Not on file  Social Connections:   . Frequency of Communication with Friends and Family: Not on file  . Frequency of Social Gatherings with Friends and Family: Not on file  . Attends Religious Services: Not on file  . Active Member of Clubs or Organizations: Not on file  . Attends Club or Organization Meetings: Not on file  . Marital Status: Not on file  Intimate Partner Violence:   . Fear of Current or Ex-Partner: Not on file  . Emotionally Abused: Not on file  . Physically Abused: Not on file  . Sexually Abused: Not on file    Family History  Problem Relation Age of Onset  . Breast cancer Mother 62  . Ovarian cancer Mother 50  . Heart attack Mother   . Endometrial cancer Maternal Grandmother 62  . Lung cancer Maternal Grandfather 60  . Lung cancer Paternal Grandfather 60  . Bladder Cancer Cousin     Review of Systems  Constitutional: Negative.   HENT: Negative.   Eyes: Negative.   Respiratory: Negative.   Cardiovascular: Negative.   Gastrointestinal: Negative.    Genitourinary: Negative.   Musculoskeletal: Negative.   Skin: Negative.   Neurological: Negative.   Psychiatric/Behavioral: Negative.      Physical Exam BP 122/78   Ht 5' 6" (1.676 m)   Wt 236 lb (107 kg)   LMP 03/13/2019   BMI 38.09 kg/m    Physical Exam Constitutional:      General: She is not in acute distress.    Appearance: Normal appearance. She is well-developed.  Genitourinary:     Pelvic exam was performed with patient in the lithotomy position.     Vulva, urethra, bladder and uterus normal.     No inguinal adenopathy present in the right or left side.    No signs of   injury in the vagina.     No vaginal discharge, erythema, tenderness or bleeding.     No cervical motion tenderness, discharge, lesion or polyp.     Uterus is mobile.     Uterus is not enlarged or tender.     No uterine mass detected.    Uterus is anteverted.     No right or left adnexal mass present.     Right adnexa not tender or full.     Left adnexa not tender or full.  HENT:     Head: Normocephalic and atraumatic.  Eyes:     General: No scleral icterus.    Conjunctiva/sclera: Conjunctivae normal.  Neck:     Thyroid: No thyromegaly.  Cardiovascular:     Rate and Rhythm: Normal rate and regular rhythm.     Heart sounds: No murmur heard.  No friction rub. No gallop.   Pulmonary:     Effort: Pulmonary effort is normal. No respiratory distress.     Breath sounds: Normal breath sounds. No wheezing or rales.  Chest:     Breasts:        Right: No inverted nipple, mass, nipple discharge, skin change or tenderness.        Left: No inverted nipple, mass, nipple discharge, skin change or tenderness.  Abdominal:     General: Bowel sounds are normal. There is no distension.     Palpations: Abdomen is soft. There is no mass.     Tenderness: There is no abdominal tenderness. There is no guarding or rebound.  Musculoskeletal:        General: No swelling or tenderness. Normal range of motion.      Cervical back: Normal range of motion and neck supple.  Lymphadenopathy:     Cervical: No cervical adenopathy.     Lower Body: No right inguinal adenopathy. No left inguinal adenopathy.  Neurological:     General: No focal deficit present.     Mental Status: She is alert and oriented to person, place, and time.     Cranial Nerves: No cranial nerve deficit.  Skin:    General: Skin is warm and dry.     Findings: No erythema or rash.  Psychiatric:        Mood and Affect: Mood normal.        Behavior: Behavior normal.        Judgment: Judgment normal.     Female chaperone present for pelvic and breast  portions of the physical exam  Results: AUDIT Questionnaire (screen for alcoholism): 2 PHQ-9: 1   Assessment: 39 y.o. G0P0000 female here for routine annual gynecologic examination  Plan: Problem List Items Addressed This Visit    None    Visit Diagnoses    Women's annual routine gynecological examination    -  Primary   Screening for depression       Screening for alcoholism          Screening: -- Blood pressure screen normal -- Weight screening: overweight: continue to monitor -- Depression screening negative (PHQ-9) -- Nutrition: normal -- cholesterol screening: per PCP -- osteoporosis screening: not due -- tobacco screening: using: discussed quitting using the 5 A's -- alcohol screening: AUDIT questionnaire indicates low-risk usage. -- family history of breast cancer screening: done. not at high risk. -- no evidence of domestic violence or intimate partner violence. -- STD screening: gonorrhea/chlamydia NAAT not collected per patient request. -- pap smear not collected per ASCCP guidelines -- flu vaccine:   would like one today. -- COVID19: vaccine received.   Stephen Jackson, MD 03/21/2020 3:37 PM   

## 2020-06-27 ENCOUNTER — Other Ambulatory Visit: Payer: Self-pay | Admitting: Internal Medicine

## 2020-06-27 DIAGNOSIS — Z1231 Encounter for screening mammogram for malignant neoplasm of breast: Secondary | ICD-10-CM

## 2020-07-13 ENCOUNTER — Other Ambulatory Visit: Payer: Self-pay

## 2020-07-13 ENCOUNTER — Ambulatory Visit
Admission: RE | Admit: 2020-07-13 | Discharge: 2020-07-13 | Disposition: A | Discharge status: Home / Self Care | Payer: BC Managed Care – PPO | Source: Ambulatory Visit | Attending: Internal Medicine | Admitting: Internal Medicine

## 2020-07-13 DIAGNOSIS — Z1231 Encounter for screening mammogram for malignant neoplasm of breast: Secondary | ICD-10-CM | POA: Diagnosis not present

## 2020-08-01 ENCOUNTER — Other Ambulatory Visit: Payer: BC Managed Care – PPO | Admitting: Urology

## 2020-08-08 ENCOUNTER — Encounter: Payer: Self-pay | Admitting: Urology

## 2020-08-08 ENCOUNTER — Other Ambulatory Visit: Payer: BC Managed Care – PPO | Admitting: Urology

## 2020-08-16 ENCOUNTER — Other Ambulatory Visit: Payer: Self-pay | Admitting: Obstetrics and Gynecology

## 2020-08-16 DIAGNOSIS — F32A Depression, unspecified: Secondary | ICD-10-CM

## 2020-08-16 MED ORDER — SERTRALINE HCL 100 MG PO TABS: 100.0000 mg | ORAL_TABLET | Freq: Every day | ORAL | 0 refills | Status: DC

## 2020-08-29 ENCOUNTER — Encounter: Payer: Self-pay | Admitting: Urology

## 2020-08-29 ENCOUNTER — Ambulatory Visit (INDEPENDENT_AMBULATORY_CARE_PROVIDER_SITE_OTHER): Payer: BC Managed Care – PPO | Admitting: Urology

## 2020-08-29 ENCOUNTER — Other Ambulatory Visit: Payer: Self-pay

## 2020-08-29 VITALS — BP 117/76 | HR 86 | Ht 66.0 in | Wt 236.0 lb

## 2020-08-29 DIAGNOSIS — N329 Bladder disorder, unspecified: Secondary | ICD-10-CM

## 2020-08-29 DIAGNOSIS — Z86018 Personal history of other benign neoplasm: Secondary | ICD-10-CM

## 2020-08-29 LAB — URINALYSIS, COMPLETE
Bilirubin, UA: NEGATIVE
Glucose, UA: NEGATIVE
Ketones, UA: NEGATIVE
Leukocytes,UA: NEGATIVE
Nitrite, UA: NEGATIVE
Protein,UA: NEGATIVE
RBC, UA: NEGATIVE
Specific Gravity, UA: 1.03 (ref 1.005–1.030)
Urobilinogen, Ur: 0.2 mg/dL (ref 0.2–1.0)
pH, UA: 5 (ref 5.0–7.5)

## 2020-08-29 LAB — MICROSCOPIC EXAMINATION: Epithelial Cells (non renal): >10 /hpf — AB (ref 0–10)

## 2020-08-29 NOTE — Progress Notes (Signed)
   08/29/20  CC:  Chief Complaint  Patient presents with  . Cysto    HPI: 40 year old female with personal history of bladder lesion status post TURBT last year presents today for cystoscopic reevaluation.  She was found to have a papillary lesion measuring 3 mm.  She was taken to the operating room on 07/26/2019.  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  Blood pressure 117/76, pulse 86, height 5\' 6"  (1.676 m), weight 236 lb (107 kg), last menstrual period 03/13/2019. NED. A&Ox3.   No respiratory distress   Abd soft, NT, ND Normal external genitalia with patent urethral meatus  Cystoscopy Procedure Note  Patient identification was confirmed, informed consent was obtained, and patient was prepped using Betadine solution.  Lidocaine jelly was administered per urethral meatus.    Procedure: - Flexible cystoscope introduced, without any difficulty.   - Thorough search of the bladder revealed:    normal urethral meatus    normal urothelium    no stones    no ulcers     no tumors    no urethral polyps    no trabeculation  - Ureteral orifices were normal in position and appearance.  Post-Procedure: - Patient tolerated the procedure well  Assessment/ Plan:  1. History of benign bladder tumor No evidence of disease today, patient was reassured  We discussed that her pathology was benign however did have a somewhat suspicious pedunculated appearance.  We discussed risks and benefits of continuing annual cystoscopy, she strongly would like to continue this because of her strong family history of malignancy and for reassurance.    Will plan for cystoscopy again next year.   - Urinalysis, Complete   Hollice Espy, MD

## 2020-08-30 ENCOUNTER — Encounter: Payer: Self-pay | Admitting: Urology

## 2021-01-02 ENCOUNTER — Ambulatory Visit: Payer: BC Managed Care – PPO | Admitting: Obstetrics and Gynecology

## 2021-01-12 ENCOUNTER — Ambulatory Visit (INDEPENDENT_AMBULATORY_CARE_PROVIDER_SITE_OTHER): Payer: BC Managed Care – PPO | Admitting: Obstetrics and Gynecology

## 2021-01-12 ENCOUNTER — Encounter: Payer: Self-pay | Admitting: Obstetrics and Gynecology

## 2021-01-12 ENCOUNTER — Other Ambulatory Visit: Payer: Self-pay

## 2021-01-12 DIAGNOSIS — F32A Depression, unspecified: Secondary | ICD-10-CM | POA: Diagnosis not present

## 2021-01-12 DIAGNOSIS — F419 Anxiety disorder, unspecified: Secondary | ICD-10-CM | POA: Diagnosis not present

## 2021-01-12 MED ORDER — BUPROPION HCL ER (XL) 150 MG PO TB24: 150.0000 mg | ORAL_TABLET | Freq: Every day | ORAL | 0 refills | Status: DC

## 2021-01-12 MED ORDER — SERTRALINE HCL 100 MG PO TABS: 100.0000 mg | ORAL_TABLET | Freq: Every day | ORAL | 0 refills | Status: AC

## 2021-01-12 NOTE — Progress Notes (Signed)
Virtual Visit via Telephone Note  I connected with Valerie Massey on 01/12/21 at 11:30 AM EDT by telephone and verified that I am speaking with the correct person using two identifiers.   I discussed the limitations, risks, security and privacy concerns of performing an evaluation and management service by telephone and the availability of in person appointments. I also discussed with the patient that there may be a patient responsible charge related to this service. The patient expressed understanding and agreed to proceed.  The patient was at home I spoke with the patient from my  office The names of people involved in this encounter were: Valerie Massey and Prentice Docker, MD.   History of Present Illness: 40 y.o. [place] female who presents in follow up for medication management.  She is taking zoloft 100 mg.  She states that she is doing well on the medication. She has a little bit of agitation when there is a little more than normal stress. She has not had a panic attack in a while. She believes that the medication is deteriorating her libido.  She has no issues with the physical aspect (orgasm, enjoyment of intercourse).   Observations/Objective: Physical Exam could not be performed. Because of the COVID-19 outbreak this visit was performed over the phone and not in person.   PHQ-9: 2  Assessment and Plan:   ICD-10-CM   1. Anxiety and depression  F41.9 sertraline (ZOLOFT) 100 MG tablet   F32.A buPROPion (WELLBUTRIN XL) 150 MG 24 hr tablet      Follow Up Instructions: Keep already scheduled appt in a few weeks. Add Wellbutrin to see if gets help with libido.    I discussed the assessment and treatment plan with the patient. The patient was provided an opportunity to ask questions and all were answered. The patient agreed with the plan and demonstrated an understanding of the instructions.   The patient was advised to call back or seek an in-person evaluation if the symptoms  worsen or if the condition fails to improve as anticipated.  I provided 16 minutes of non-face-to-face time during this encounter.  Prentice Docker, MD  [facility] 01/12/2021 12:30 PM

## 2021-01-21 ENCOUNTER — Emergency Department: Payer: BC Managed Care – PPO

## 2021-01-21 ENCOUNTER — Encounter: Payer: Self-pay | Admitting: Emergency Medicine

## 2021-01-21 ENCOUNTER — Emergency Department
Admission: EM | Admit: 2021-01-21 | Discharge: 2021-01-21 | Disposition: A | Discharge status: Home / Self Care | Payer: BC Managed Care – PPO | Attending: Emergency Medicine | Admitting: Emergency Medicine

## 2021-01-21 ENCOUNTER — Other Ambulatory Visit: Payer: Self-pay

## 2021-01-21 DIAGNOSIS — R0789 Other chest pain: Secondary | ICD-10-CM | POA: Insufficient documentation

## 2021-01-21 DIAGNOSIS — R079 Chest pain, unspecified: Secondary | ICD-10-CM | POA: Diagnosis present

## 2021-01-21 DIAGNOSIS — F17210 Nicotine dependence, cigarettes, uncomplicated: Secondary | ICD-10-CM | POA: Insufficient documentation

## 2021-01-21 DIAGNOSIS — R202 Paresthesia of skin: Secondary | ICD-10-CM

## 2021-01-21 LAB — CBC WITH DIFFERENTIAL/PLATELET
Abs Immature Granulocytes: 0.04 10*3/uL (ref 0.00–0.07)
Basophils Absolute: 0 10*3/uL (ref 0.0–0.1)
Basophils Relative: 0 %
Eosinophils Absolute: 0.3 10*3/uL (ref 0.0–0.5)
Eosinophils Relative: 3 %
HCT: 37.6 % (ref 36.0–46.0)
Hemoglobin: 13.2 g/dL (ref 12.0–15.0)
Immature Granulocytes: 1 %
Lymphocytes Relative: 25 %
Lymphs Abs: 2.1 10*3/uL (ref 0.7–4.0)
MCH: 29.5 pg (ref 26.0–34.0)
MCHC: 35.1 g/dL (ref 30.0–36.0)
MCV: 84.1 fL (ref 80.0–100.0)
Monocytes Absolute: 0.5 10*3/uL (ref 0.1–1.0)
Monocytes Relative: 6 %
Neutro Abs: 5.3 10*3/uL (ref 1.7–7.7)
Neutrophils Relative %: 65 %
Platelets: 298 10*3/uL (ref 150–400)
RBC: 4.47 MIL/uL (ref 3.87–5.11)
RDW: 14.4 % (ref 11.5–15.5)
WBC: 8.2 10*3/uL (ref 4.0–10.5)
nRBC: 0 % (ref 0.0–0.2)

## 2021-01-21 LAB — BASIC METABOLIC PANEL
Anion gap: 8 (ref 5–15)
BUN: 10 mg/dL (ref 6–20)
CO2: 26 mmol/L (ref 22–32)
Calcium: 8.6 mg/dL — ABNORMAL LOW (ref 8.9–10.3)
Chloride: 104 mmol/L (ref 98–111)
Creatinine, Ser: 0.68 mg/dL (ref 0.44–1.00)
GFR, Estimated: >60 mL/min (ref >60)
Glucose, Bld: 125 mg/dL — ABNORMAL HIGH (ref 70–99)
Potassium: 3.7 mmol/L (ref 3.5–5.1)
Sodium: 138 mmol/L (ref 135–145)

## 2021-01-21 LAB — TROPONIN I (HIGH SENSITIVITY)
Troponin I (High Sensitivity): 3 ng/L (ref <18)
Troponin I (High Sensitivity): 3 ng/L (ref <18)

## 2021-01-21 LAB — CBG MONITORING, ED: Glucose-Capillary: 123 mg/dL — ABNORMAL HIGH (ref 70–99)

## 2021-01-21 NOTE — ED Provider Notes (Signed)
MSE was initiated and I personally evaluated the patient and placed orders (if any) at  6:54 AM on January 21, 2021.  The patient appears stable so that the remainder of the MSE may be completed by another provider.   Patient is a 40 year old female with history of obesity, diabetes, hyperlipidemia who presents to the emergency department with complaints of left-sided crushing chest pain, dizziness and generalized weakness.  States that she woke up to go to the bathroom around 4:45 AM and had symptoms.  States she noticed at that time that she had numbness in her left face and felt like her left arm was weak.  States she last felt normal at about 1 AM when she went to bed.  No history of previous CVA or CAD.  EKG is nonischemic.  Patient is hemodynamically stable.  Labs, chest x-ray, head CT ordered.  Patient has a normal neurologic exam (cranial nerves II through XII intact, normal speech, normal strength in all 4 extremities, normal sensation diffusely) and has an NIH stroke scale of 0.  She is currently outside of the tPA window.  I do not feel a code stroke needs to be activated at this time.   Elida Harbin, Delice Bison, DO 01/21/21 [phone]

## 2021-01-21 NOTE — ED Notes (Signed)
This RN discussed patient's care with Dr. Leonides Schanz, per Dr. Leonides Schanz pt to come back to room for assessment.

## 2021-01-21 NOTE — ED Triage Notes (Addendum)
Pt states awoke approx 1 hr ago, states after awakening noted sudden onset numbness/tingling to L side of her face and L arm. Pt also c/o substernal CP, states feels like someone is pushing on the middle of her chest "as hard as they can". Pt with facial symmetry intact, equal grip strength, no drift noted, no neglect noted, no slurred speech.   Pt states [place] 0445 this morning.

## 2021-01-21 NOTE — ED Provider Notes (Signed)
[facility] Emergency Department Provider Note  Time seen: 7:47 AM  I have reviewed the triage vital signs and the nursing notes.   HISTORY  Chief Complaint Numbness and Chest Pain   HPI Valerie Massey is a 40 y.o. female with a past medical history of anemia, depression, gastric reflux, presents to the emergency department for chest pain and tingling.  According to the patient around 430 this morning she woke up and was experiencing a tingling sensation in her left face and noted pain to her chest and somewhat radiating down her left arm.  States she felt somewhat short of breath.  Patient denies any numbness or diaphoresis, no nausea.  States mom has a history of cardiac disease in her mid to late 57s.  No family history of stroke.  Patient denies any weakness or numbness.   Past Medical History:  Diagnosis Date   Anemia    BRCA negative 2015   MyRisk neg   Bronchitis    Depression    Family history of breast cancer 2015   IBIS=14.1%   Family history of ovarian cancer    Fatty liver    GERD (gastroesophageal reflux disease)    History of hiatal hernia    Migraines     Patient Active Problem List   Diagnosis Date Noted   Endometrial polyp 02/10/2018   Menorrhagia with irregular cycle 01/28/2018    Past Surgical History:  Procedure Laterality Date   ABDOMINAL HYSTERECTOMY     CYSTOSCOPY N/A 06/15/2019   Procedure: CYSTOSCOPY;  Surgeon: Will Bonnet, MD;  Location: [facility] ORS;  Service: Gynecology;  Laterality: N/A;   CYSTOSCOPY W/ RETROGRADES Bilateral 07/26/2019   Procedure: CYSTOSCOPY WITH RETROGRADE PYELOGRAM;  Surgeon: Hollice Espy, MD;  Location: [facility] OR[facility];  Service: Urology;  Laterality: Bilateral;   DILATATION & CURETTAGE/HYSTEROSCOPY WITH MYOSURE N/A 03/12/2018   Procedure: DILATATION & CURETTAGE/HYSTEROSCOPY WITH MYOSURE;  Surgeon: Will Bonnet, MD;  Location: [facility] OR[facility];  Service: Gynecology;  Laterality: N/A;   DILATION AND  CURETTAGE OF UTERUS     PARTIAL HYSTERECTOMY     TOTAL LAPAROSCOPIC HYSTERECTOMY WITH SALPINGECTOMY N/A 06/15/2019   Procedure: TOTAL LAPAROSCOPIC HYSTERECTOMY WITH BILATERAL SALPINGECTOMY;  Surgeon: Will Bonnet, MD;  Location: [facility] ORS;  Service: Gynecology;  Laterality: N/A;   TRANSURETHRAL RESECTION OF BLADDER TUMOR WITH MITOMYCIN-C N/A 07/26/2019   Procedure: TRANSURETHRAL RESECTION OF BLADDER TUMOR WITH gemcitabine;  Surgeon: Hollice Espy, MD;  Location: [facility] ORS;  Service: Urology;  Laterality: N/A;   WISDOM TOOTH EXTRACTION      Prior to Admission medications   Medication Sig Start Date End Date Taking? Authorizing Provider  acetaminophen (TYLENOL) 500 MG tablet Take 500 mg by mouth every 6 (six) hours as needed (for pain.).    [provider]  albuterol (VENTOLIN HFA) 108 (90 Base) MCG/ACT inhaler Inhale 2 puffs into the lungs every 6 (six) hours as needed. 10/21/19   [provider]  buPROPion (WELLBUTRIN XL) 150 MG 24 hr tablet Take 1 tablet (150 mg total) by mouth daily. 01/12/21   Will Bonnet, MD  glipiZIDE (GLUCOTROL XL) 2.5 MG 24 hr tablet TAKE 1 TABLET(2.5 MG) BY MOUTH EVERY DAY 11/23/19   [provider]  lisinopril (ZESTRIL) 5 MG tablet Take by mouth. 12/30/19 12/29/20  [provider]  LORazepam (ATIVAN) 0.5 MG tablet Take 1 tablet (0.5 mg total) by mouth every 12 (twelve) hours as needed for anxiety. 01/31/20   Will Bonnet, MD  losartan (COZAAR) 25 MG tablet Take 25 mg by mouth daily. 08/14/20   [provider]  Multiple Vitamin (MULTIVITAMIN WITH MINERALS) TABS tablet Take 1 tablet by mouth daily.     [provider]  omeprazole (PRILOSEC) 20 MG capsule Take 20 mg by mouth 2 (two) times daily.    [provider]  oxybutynin (DITROPAN) 5 MG tablet Take 1 tablet (5 mg total) by mouth every 8 (eight) hours as needed for bladder spasms. 07/26/19   Hollice Espy, MD  sertraline (ZOLOFT) 100 MG tablet Take  1 tablet (100 mg total) by mouth daily. 01/12/21   Will Bonnet, MD  sodium chloride (OCEAN) 0.65 % SOLN nasal spray Place 1-2 sprays into both nostrils 4 (four) times daily as needed for congestion.    [provider]    Allergies  Allergen Reactions   Bee Venom Hives and Swelling   Other Nausea And Vomiting    scallops   Tamiflu [Oseltamivir Phosphate] Hives    Family History  Problem Relation Age of Onset   Breast cancer Mother 56   Ovarian cancer Mother 28   Heart attack Mother    Endometrial cancer Maternal Grandmother 41   Lung cancer Maternal Grandfather 67   Lung cancer Paternal Grandfather 75   Bladder Cancer Cousin     Social History Social History   Tobacco Use   Smoking status: Every Day    Packs/day: 0.50    Types: Cigarettes    Start date: 05/06/1997   Smokeless tobacco: Never  Vaping Use   Vaping Use: Never used  Substance Use Topics   Alcohol use: Yes    Comment: 1 q month   Drug use: Never    Review of Systems Constitutional: Negative for fever. Cardiovascular: States moderate chest pressure, states it feels like someone is sitting on her chest.  Some radiation to the left arm. Respiratory: Negative for shortness of breath. Gastrointestinal: Negative for abdominal pain, vomiting and diarrhea. Genitourinary: Negative for urinary compaints Musculoskeletal: Negative for musculoskeletal complaints Neurological: Negative for headache.  States tingling in her left face this morning now resolved.  Denies any weakness or numbness. All other ROS negative  ____________________________________________   PHYSICAL EXAM:  VITAL SIGNS: ED Triage Vitals  Enc Vitals Group     BP 01/21/21 0648 (!) 142/78     Pulse Rate 01/21/21 0648 73     Resp 01/21/21 0648 (!) 24     Temp 01/21/21 0648 97.8 F (36.6 C)     Temp Source 01/21/21 0648 Oral     SpO2 01/21/21 0648 96 %     Weight 01/21/21 0643 240 lb (108.9 kg)     Height 01/21/21 0643 $RemoveBefor'5\' 6"'[id number]$   (1.676 m)     Head Circumference --      Peak Flow --      Pain Score 01/21/21 0643 4     Pain Loc --      Pain Edu? --      Excl. in [place]? --    Constitutional: Alert and oriented. Well appearing and in no distress. Eyes: Normal exam ENT      Head: Normocephalic and atraumatic.      Mouth/Throat: Mucous membranes are moist. Cardiovascular: Normal rate, regular rhythm.  Respiratory: Normal respiratory effort without tachypnea nor retractions. Breath sounds are clear Gastrointestinal: Soft and nontender. No distention.   Musculoskeletal: Nontender with normal range of motion in all extremities.  Neurologic:  Normal speech and language. No gross  focal neurologic deficits.  5/5 motor in all extremities.  No pronator drift.  Cranial nerves intact. Skin:  Skin is warm, dry and intact.  Psychiatric: Mood and affect are normal.   ____________________________________________    EKG  EKG viewed and interpreted by myself shows a normal sinus rhythm at 74 bpm with a narrow QRS, normal axis, normal intervals, no concerning ST changes.  ____________________________________________    RADIOLOGY  CT scan of the head is negative. Chest x-ray is negative.  ____________________________________________   INITIAL IMPRESSION / ASSESSMENT AND PLAN / ED COURSE  Pertinent labs & imaging results that were available during my care of the patient were reviewed by me and considered in my medical decision making (see chart for details).   Patient presents to the emergency department for tingling of her left face as well as chest discomfort which she describes feels like a pressure or somebody sitting on the chest.  No nausea or diaphoresis.  No weakness or numbness.  No fever or cough.  Overall patient appears well, no distress.  Reassuring physical exam.  Normal neurological exam and reassuring vitals.  We will check labs including cardiac enzymes x2.  CT scan and chest x-ray are largely normal as  well.  Patient's work-up is reassuring.  CT and chest x-ray normal.  EKG is reassuring.  Lab work largely within normal limits including negative troponin x2.  Given the patient's reassuring work-up I believe she is safe for discharge home with PCP follow-up.  Patient states she is feeling better just feels tired currently.  Valerie Massey was evaluated in Emergency Department on 01/21/2021 for the symptoms described in the history of present illness. She was evaluated in the context of the global COVID-19 pandemic, which necessitated consideration that the patient might be at risk for infection with the SARS-CoV-2 virus that causes COVID-19. Institutional protocols and algorithms that pertain to the evaluation of patients at risk for COVID-19 are in a state of rapid change based on information released by regulatory bodies including the CDC and federal and state organizations. These policies and algorithms were followed during the patient's care in the ED.  ____________________________________________   FINAL CLINICAL IMPRESSION(S) / ED DIAGNOSES  Chest pain Paresthesias   Harvest Dark, MD 01/21/21 1027

## 2021-01-31 ENCOUNTER — Ambulatory Visit: Payer: BC Managed Care – PPO | Admitting: Obstetrics and Gynecology

## 2021-02-12 ENCOUNTER — Ambulatory Visit: Payer: BC Managed Care – PPO | Admitting: Obstetrics and Gynecology

## 2021-04-06 ENCOUNTER — Encounter: Payer: Self-pay | Admitting: Obstetrics and Gynecology

## 2021-05-01 ENCOUNTER — Telehealth: Payer: Self-pay

## 2021-05-01 ENCOUNTER — Other Ambulatory Visit: Payer: Self-pay | Admitting: Obstetrics & Gynecology

## 2021-05-01 DIAGNOSIS — F32A Depression, unspecified: Secondary | ICD-10-CM

## 2021-05-01 MED ORDER — BUPROPION HCL ER (XL) 150 MG PO TB24: 150.0000 mg | ORAL_TABLET | Freq: Every day | ORAL | 0 refills | Status: AC

## 2021-05-01 NOTE — Telephone Encounter (Signed)
Pt calling to see if [facility] can refill her Wellbutrin she's aware he's no longer here but only has 2 or 3 pills left. Can we send in a refill or What should she do? I advised pt if she was not going to continue care with SDJ she would need to see a different provider here, she's not sure what she is going to do but does not want to run out of med's.

## 2021-05-01 NOTE — Telephone Encounter (Signed)
Refill sent, one time

## 2021-05-02 NOTE — Telephone Encounter (Signed)
Called pt will try again later no answer

## 2021-06-13 ENCOUNTER — Emergency Department: Payer: BC Managed Care – PPO

## 2021-06-13 ENCOUNTER — Other Ambulatory Visit: Payer: Self-pay

## 2021-06-13 ENCOUNTER — Emergency Department
Admission: EM | Admit: 2021-06-13 | Discharge: 2021-06-13 | Disposition: A | Discharge status: Home / Self Care | Payer: BC Managed Care – PPO | Attending: Emergency Medicine | Admitting: Emergency Medicine

## 2021-06-13 ENCOUNTER — Encounter: Payer: Self-pay | Admitting: Emergency Medicine

## 2021-06-13 DIAGNOSIS — E119 Type 2 diabetes mellitus without complications: Secondary | ICD-10-CM | POA: Diagnosis not present

## 2021-06-13 DIAGNOSIS — R197 Diarrhea, unspecified: Secondary | ICD-10-CM | POA: Insufficient documentation

## 2021-06-13 DIAGNOSIS — R202 Paresthesia of skin: Secondary | ICD-10-CM | POA: Diagnosis not present

## 2021-06-13 DIAGNOSIS — R519 Headache, unspecified: Secondary | ICD-10-CM | POA: Insufficient documentation

## 2021-06-13 DIAGNOSIS — R7989 Other specified abnormal findings of blood chemistry: Secondary | ICD-10-CM | POA: Diagnosis not present

## 2021-06-13 DIAGNOSIS — R140 Abdominal distension (gaseous): Secondary | ICD-10-CM | POA: Insufficient documentation

## 2021-06-13 DIAGNOSIS — R1084 Generalized abdominal pain: Secondary | ICD-10-CM | POA: Diagnosis present

## 2021-06-13 DIAGNOSIS — R110 Nausea: Secondary | ICD-10-CM | POA: Insufficient documentation

## 2021-06-13 DIAGNOSIS — K219 Gastro-esophageal reflux disease without esophagitis: Secondary | ICD-10-CM | POA: Diagnosis not present

## 2021-06-13 DIAGNOSIS — Z7984 Long term (current) use of oral hypoglycemic drugs: Secondary | ICD-10-CM | POA: Insufficient documentation

## 2021-06-13 LAB — URINALYSIS, ROUTINE W REFLEX MICROSCOPIC
Bacteria, UA: NONE SEEN
Bilirubin Urine: NEGATIVE
Glucose, UA: NEGATIVE mg/dL
Hgb urine dipstick: NEGATIVE
Ketones, ur: NEGATIVE mg/dL
Leukocytes,Ua: NEGATIVE
Nitrite: NEGATIVE
Protein, ur: 30 mg/dL — AB
Specific Gravity, Urine: 1.033 — ABNORMAL HIGH (ref 1.005–1.030)
pH: 5 (ref 5.0–8.0)

## 2021-06-13 LAB — CBC WITH DIFFERENTIAL/PLATELET
Abs Immature Granulocytes: 0.04 10*3/uL (ref 0.00–0.07)
Basophils Absolute: 0 10*3/uL (ref 0.0–0.1)
Basophils Relative: 0 %
Eosinophils Absolute: 0.2 10*3/uL (ref 0.0–0.5)
Eosinophils Relative: 2 %
HCT: 42.1 % (ref 36.0–46.0)
Hemoglobin: 13.9 g/dL (ref 12.0–15.0)
Immature Granulocytes: 0 %
Lymphocytes Relative: 18 %
Lymphs Abs: 1.8 10*3/uL (ref 0.7–4.0)
MCH: 28.8 pg (ref 26.0–34.0)
MCHC: 33 g/dL (ref 30.0–36.0)
MCV: 87.2 fL (ref 80.0–100.0)
Monocytes Absolute: 0.6 10*3/uL (ref 0.1–1.0)
Monocytes Relative: 6 %
Neutro Abs: 7.4 10*3/uL (ref 1.7–7.7)
Neutrophils Relative %: 74 %
Platelets: 300 10*3/uL (ref 150–400)
RBC: 4.83 MIL/uL (ref 3.87–5.11)
RDW: 14.6 % (ref 11.5–15.5)
WBC: 10.1 10*3/uL (ref 4.0–10.5)
nRBC: 0 % (ref 0.0–0.2)

## 2021-06-13 LAB — COMPREHENSIVE METABOLIC PANEL
ALT: 36 U/L (ref 0–44)
AST: 37 U/L (ref 15–41)
Albumin: 4.2 g/dL (ref 3.5–5.0)
Alkaline Phosphatase: 78 U/L (ref 38–126)
Anion gap: 8 (ref 5–15)
BUN: 12 mg/dL (ref 6–20)
CO2: 23 mmol/L (ref 22–32)
Calcium: 8.7 mg/dL — ABNORMAL LOW (ref 8.9–10.3)
Chloride: 104 mmol/L (ref 98–111)
Creatinine, Ser: 0.72 mg/dL (ref 0.44–1.00)
GFR, Estimated: >60 mL/min (ref >60)
Glucose, Bld: 129 mg/dL — ABNORMAL HIGH (ref 70–99)
Potassium: 3.7 mmol/L (ref 3.5–5.1)
Sodium: 135 mmol/L (ref 135–145)
Total Bilirubin: 0.6 mg/dL (ref 0.3–1.2)
Total Protein: 8.8 g/dL — ABNORMAL HIGH (ref 6.5–8.1)

## 2021-06-13 LAB — LIPASE, BLOOD: Lipase: 28 U/L (ref 11–51)

## 2021-06-13 LAB — SEDIMENTATION RATE: Sed Rate: 43 mm/hr — ABNORMAL HIGH (ref 0–20)

## 2021-06-13 LAB — C-REACTIVE PROTEIN: CRP: 1.8 mg/dL — ABNORMAL HIGH (ref <1.0)

## 2021-06-13 MED ORDER — IOHEXOL 300 MG/ML  SOLN
100.0000 mL | Freq: Once | INTRAMUSCULAR | Status: AC | PRN
  Administered 2021-06-13: 100 mL via INTRAVENOUS

## 2021-06-13 MED ORDER — SODIUM CHLORIDE 0.9 % IV BOLUS (SEPSIS)
1000.0000 mL | Freq: Once | INTRAVENOUS | Status: AC
  Administered 2021-06-13: 1000 mL via INTRAVENOUS

## 2021-06-13 MED ORDER — ONDANSETRON 4 MG PO TBDP: 4.0000 mg | ORAL_TABLET | Freq: Three times a day (TID) | ORAL | 0 refills | Status: DC | PRN

## 2021-06-13 MED ORDER — PROCHLORPERAZINE EDISYLATE 10 MG/2ML IJ SOLN
10.0000 mg | Freq: Once | INTRAMUSCULAR | Status: AC
  Administered 2021-06-13: 10 mg via INTRAVENOUS
  Filled 2021-06-13: qty 2

## 2021-06-13 MED ORDER — ONDANSETRON HCL 4 MG/2ML IJ SOLN
4.0000 mg | Freq: Once | INTRAMUSCULAR | Status: AC
  Administered 2021-06-13: 4 mg via INTRAVENOUS
  Filled 2021-06-13: qty 2

## 2021-06-13 MED ORDER — KETOROLAC TROMETHAMINE 30 MG/ML IJ SOLN
30.0000 mg | Freq: Once | INTRAMUSCULAR | Status: AC
  Administered 2021-06-13: 30 mg via INTRAVENOUS
  Filled 2021-06-13: qty 1

## 2021-06-13 NOTE — Discharge Instructions (Addendum)
Use Tylenol for pain and fevers.  Up to 1000 mg per dose, up to 4 times per day.  Do not take more than 4000 mg of Tylenol/acetaminophen within 24 hours..  Use the Zofran as needed for any further nausea and vomiting.  If you develop any fevers or severely worsening pain with your symptoms, please return to the ED.

## 2021-06-13 NOTE — ED Provider Notes (Addendum)
[facility] Provider Note    Event Date/Time   First MD Initiated Contact with Patient 06/13/21 [phone]     (approximate)   History   Nausea   HPI  Valerie Massey is a 41 y.o. female with history of migraines, obesity, type 2 diabetes, hypertriglyceridemia, GERD, depression who presents to the emergency department generalized abdominal pain that she describes as a "bloat" that started 5 days ago.  She states it started a day after she gave herself an injection of Mounjaro.  She states she contacted her doctor who sent a prescription in for antiemetics and told her to stop the [facility].  She states symptoms have progressively worsened and she feels her abdomen is swollen.  She has not been able to eat or drink without feeling worse.  She feels very weak.  She has had nausea without vomiting.  Started having diarrhea today.  No dysuria, hematuria, vaginal bleeding or discharge.  She has had previous partial hysterectomy.  She denies any sick contacts or recent international travel.  She denies any chest pain or shortness of breath.  She also reports now she is having a left-sided headache that started today and feels so much pressure in the left side of her head that is causing the left side of her face to tingle but she denies numbness, weakness.  No head injury.  Not on blood thinners.  States pain is different than her migraine headaches.  She is tender to palpation over the left side of her face without facial swelling.  Denies any vision changes.     History provided by patient and family member at bedside.    Past Medical History:  Diagnosis Date   Anemia    BRCA negative 2015   MyRisk neg   Bronchitis    Depression    Family history of breast cancer 2015   IBIS=14.1%   Family history of ovarian cancer    Fatty liver    GERD (gastroesophageal reflux disease)    History of hiatal hernia    Migraines     Past Surgical History:  Procedure  Laterality Date   ABDOMINAL HYSTERECTOMY     CYSTOSCOPY N/A 06/15/2019   Procedure: CYSTOSCOPY;  Surgeon: Will Bonnet, MD;  Location: [facility] ORS;  Service: Gynecology;  Laterality: N/A;   CYSTOSCOPY W/ RETROGRADES Bilateral 07/26/2019   Procedure: CYSTOSCOPY WITH RETROGRADE PYELOGRAM;  Surgeon: Hollice Espy, MD;  Location: [facility] OR[facility];  Service: Urology;  Laterality: Bilateral;   DILATATION & CURETTAGE/HYSTEROSCOPY WITH MYOSURE N/A 03/12/2018   Procedure: DILATATION & CURETTAGE/HYSTEROSCOPY WITH MYOSURE;  Surgeon: Will Bonnet, MD;  Location: [facility];  Service: Gynecology;  Laterality: N/A;   DILATION AND CURETTAGE OF UTERUS     PARTIAL HYSTERECTOMY     TOTAL LAPAROSCOPIC HYSTERECTOMY WITH SALPINGECTOMY N/A 06/15/2019   Procedure: TOTAL LAPAROSCOPIC HYSTERECTOMY WITH BILATERAL SALPINGECTOMY;  Surgeon: Will Bonnet, MD;  Location: [facility] OR[facility];  Service: Gynecology;  Laterality: N/A;   TRANSURETHRAL RESECTION OF BLADDER TUMOR WITH MITOMYCIN-C N/A 07/26/2019   Procedure: TRANSURETHRAL RESECTION OF BLADDER TUMOR WITH gemcitabine;  Surgeon: Hollice Espy, MD;  Location: [facility];  Service: Urology;  Laterality: N/A;   WISDOM TOOTH EXTRACTION      MEDICATIONS:  Prior to Admission medications   Medication Sig Start Date End Date Taking? Authorizing Provider  acetaminophen (TYLENOL) 500 MG tablet Take 500 mg by mouth every 6 (six) hours as needed (for pain.).    [provider]  albuterol (  VENTOLIN HFA) 108 (90 Base) MCG/ACT inhaler Inhale 2 puffs into the lungs every 6 (six) hours as needed. 10/21/19   [provider]  buPROPion (WELLBUTRIN XL) 150 MG 24 hr tablet Take 1 tablet (150 mg total) by mouth daily. 05/01/21   Gae Dry, MD  glipiZIDE (GLUCOTROL XL) 2.5 MG 24 hr tablet TAKE 1 TABLET(2.5 MG) BY MOUTH EVERY DAY 11/23/19   [provider]  lisinopril (ZESTRIL) 5 MG tablet Take by mouth. 12/30/19 12/29/20  [provider]  LORazepam (ATIVAN) 0.5  MG tablet Take 1 tablet (0.5 mg total) by mouth every 12 (twelve) hours as needed for anxiety. 01/31/20   Will Bonnet, MD  losartan (COZAAR) 25 MG tablet Take 25 mg by mouth daily. 08/14/20   [provider]  Multiple Vitamin (MULTIVITAMIN WITH MINERALS) TABS tablet Take 1 tablet by mouth daily.     [provider]  omeprazole (PRILOSEC) 20 MG capsule Take 20 mg by mouth 2 (two) times daily.    [provider]  oxybutynin (DITROPAN) 5 MG tablet Take 1 tablet (5 mg total) by mouth every 8 (eight) hours as needed for bladder spasms. 07/26/19   Hollice Espy, MD  sertraline (ZOLOFT) 100 MG tablet Take 1 tablet (100 mg total) by mouth daily. 01/12/21   Will Bonnet, MD  sodium chloride (OCEAN) 0.65 % SOLN nasal spray Place 1-2 sprays into both nostrils 4 (four) times daily as needed for congestion.    [provider]    Physical Exam   Triage Vital Signs: ED Triage Vitals  Enc Vitals Group     BP 06/13/21 0606 (!) 156/87     Pulse Rate 06/13/21 0606 91     Resp 06/13/21 0606 20     Temp 06/13/21 0606 (!) 97.4 F (36.3 C)     Temp Source 06/13/21 0606 Oral     SpO2 06/13/21 0606 95 %     Weight 06/13/21 0602 250 lb (113.4 kg)     Height 06/13/21 0602 $RemoveBefor'5\' 6"'ieDeoIXAiYLt$  (1.676 m)     Head Circumference --      Peak Flow --      Pain Score 06/13/21 0602 6     Pain Loc --      Pain Edu? --      Excl. in [place]? --     Most recent vital signs: Vitals:   06/13/21 0606  BP: (!) 156/87  Pulse: 91  Resp: 20  Temp: (!) 97.4 F (36.3 C)  SpO2: 95%    CONSTITUTIONAL: Alert and oriented and responds appropriately to questions. Well-appearing; well-nourished HEAD: Normocephalic, atraumatic, tender to palpation over the left temporal area and TMJ without swelling, redness or warmth EYES: Conjunctivae clear, pupils appear equal, sclera nonicteric ENT: normal nose; moist mucous membranes, tolerating secretions, normal phonation, no stridor, no trismus or drooling,  normal-appearing dentition, no Ludwig's angina NECK: Supple, normal ROM CARD: RRR; S1 and S2 appreciated; no murmurs, no clicks, no rubs, no gallops RESP: Normal chest excursion without splinting or tachypnea; breath sounds clear and equal bilaterally; no wheezes, no rhonchi, no rales, no hypoxia or respiratory distress, speaking full sentences ABD/GI: Normal bowel sounds; non-distended; soft, diffusely tender to palpation without guarding or rebound BACK: The back appears normal EXT: Normal ROM in all joints; no deformity noted, no edema; no cyanosis SKIN: Normal color for age and race; warm; no rash on exposed skin NEURO: Moves all extremities equally, normal speech, sensation to light touch intact  diffusely, cranial nerves II through XII intact, strength 5/5 in all 4 extremities PSYCH: The patient's mood and manner are appropriate.   ED Results / Procedures / Treatments   LABS: (all labs ordered are listed, but only abnormal results are displayed) Labs Reviewed  COMPREHENSIVE METABOLIC PANEL - Abnormal; Notable for the following components:      Result Value   Glucose, Bld 129 (*)    Calcium 8.7 (*)    Total Protein 8.8 (*)    All other components within normal limits  URINALYSIS, ROUTINE W REFLEX MICROSCOPIC - Abnormal; Notable for the following components:   Color, Urine AMBER (*)    APPearance CLOUDY (*)    Specific Gravity, Urine 1.033 (*)    Protein, ur 30 (*)    All other components within normal limits  SEDIMENTATION RATE - Abnormal; Notable for the following components:   Sed Rate 43 (*)    All other components within normal limits  CBC WITH DIFFERENTIAL/PLATELET  LIPASE, BLOOD  C-REACTIVE PROTEIN     EKG:   RADIOLOGY: My personal review and interpretation of imaging:  pending  I have personally reviewed all radiology reports.   No results found.   PROCEDURES:  Critical Care performed: No     Procedures    IMPRESSION / MDM / ASSESSMENT AND PLAN /  ED COURSE  I reviewed the triage vital signs and the nursing notes.    Patient here with complaints of generalized abdominal pain, bloating and now diarrhea.  Also complaining of left-sided headache with left-sided facial tingling without focal neurologic deficits.    DIFFERENTIAL DIAGNOSIS (includes but not limited to):   Medication side effect, gastroenteritis, diverticulitis, colitis, bowel obstruction, appendicitis, cholecystitis, pancreatitis, UTI, kidney stone, constipation.    Low suspicion for CVA, CVT, intracranial hemorrhage, meningitis.  She states the headache she is having today feels different than her chronic headaches.  She is tender to palpation over the left side of her face.  Differential includes temporal arteritis, trigeminal neuralgia, TMJ.  No signs of facial cellulitis, parotitis, sialoadenitis on exam.     PLAN: We will obtain CBC, CMP, lipase, urinalysis, ESR, CRP.Marland Kitchen  We will obtain a CT of the abdomen pelvis as well as a CT of her head.  Will give Toradol, Zofran and IV fluids for symptomatic relief.   MEDICATIONS GIVEN IN ED: Medications  prochlorperazine (COMPAZINE) injection 10 mg (has no administration in time range)  ketorolac (TORADOL) 30 MG/ML injection 30 mg (30 mg Intravenous Given 06/13/21 0626)  ondansetron (ZOFRAN) injection 4 mg (4 mg Intravenous Given 06/13/21 0627)  sodium chloride 0.9 % bolus 1,000 mL (1,000 mLs Intravenous New Bag/Given 06/13/21 4097)     ED COURSE: Patient's labs reassuring.  Normal white blood cell count, hemoglobin, LFTs, lipase, renal function.  Urine shows white blood cells but also many squamous cells.  Suspect dirty catch.  She is not having urinary symptoms.  CTAP and CT head pending.  Signed out to oncoming EDP.  ESR slightly elevated at 43.  She reports improvement in her abdominal pain after Toradol but no significant improvement in her headache.  Will give IV Compazine.  CRP pending.  I reviewed all nursing notes and  pertinent previous records as available.  I have reviewed and interpreted any and all EKGs, lab and urine results, imaging and radiology reports (as available).    CONSULTS:  Pending results from workup   OUTSIDE RECORDS REVIEWED: I have reviewed patient's most recent office visit with  her primary care provider on 06/01/2021.         FINAL CLINICAL IMPRESSION(S) / ED DIAGNOSES   Final diagnoses:  Generalized abdominal pain  Left-sided headache     Rx / DC Orders   ED Discharge Orders     None        Note:  This document was prepared using Dragon voice recognition software and may include unintentional dictation errors.   Delina Kruczek, Delice Bison, DO 06/13/21 0701    Avalene Sealy, Delice Bison, DO 06/13/21 [phone]

## 2021-06-13 NOTE — ED Triage Notes (Signed)
Patient ambulatory to triage with steady gait, without difficulty or distress noted; pt reports abd bloating and nausea, left side facial tingling

## 2021-06-13 NOTE — ED Provider Notes (Signed)
Patient received in signout from Dr. Leonides Schanz pending CT head and CT abdomen/pelvis, as well as reassessment.  After receiving Toradol and Compazine, patient reports resolution of her headache and significant improvement of her abdominal discomfort and bloating sensation.  She has no left-sided temporal tenderness on my examination and she denies any vision changes.  Slightly elevated ESR is noted, but she does not meet criteria for diagnosis of temporal arteritis at this time per [facility] of rheumatology criteria.  CT head and abdomen/pelvis are noted to be without evidence of acute pathology.  Considering her resolution of symptoms and tolerance of p.o. intake, I see no barriers to outpatient management.  We will ensure that she has antiemetics at home and have her follow-up with her PCP.  Return precautions for the ED discussed.   Vladimir Crofts, MD 06/13/21 [phone]

## 2021-06-13 NOTE — ED Notes (Signed)
Patient to CT at this time

## 2021-08-07 ENCOUNTER — Other Ambulatory Visit: Payer: Self-pay | Admitting: Obstetrics & Gynecology

## 2021-08-07 DIAGNOSIS — F32A Depression, unspecified: Secondary | ICD-10-CM

## 2021-08-13 ENCOUNTER — Other Ambulatory Visit: Payer: Self-pay | Admitting: Internal Medicine

## 2021-08-13 DIAGNOSIS — Z1231 Encounter for screening mammogram for malignant neoplasm of breast: Secondary | ICD-10-CM

## 2021-08-20 ENCOUNTER — Emergency Department: Payer: BC Managed Care – PPO

## 2021-08-20 ENCOUNTER — Other Ambulatory Visit: Payer: Self-pay

## 2021-08-20 ENCOUNTER — Emergency Department
Admission: EM | Admit: 2021-08-20 | Discharge: 2021-08-20 | Disposition: A | Discharge status: Home / Self Care | Payer: BC Managed Care – PPO | Attending: Emergency Medicine | Admitting: Emergency Medicine

## 2021-08-20 DIAGNOSIS — R1031 Right lower quadrant pain: Secondary | ICD-10-CM | POA: Diagnosis present

## 2021-08-20 DIAGNOSIS — R109 Unspecified abdominal pain: Secondary | ICD-10-CM

## 2021-08-20 LAB — COMPREHENSIVE METABOLIC PANEL
ALT: 30 U/L (ref 0–44)
AST: 30 U/L (ref 15–41)
Albumin: 3.7 g/dL (ref 3.5–5.0)
Alkaline Phosphatase: 75 U/L (ref 38–126)
Anion gap: 9 (ref 5–15)
BUN: 7 mg/dL (ref 6–20)
CO2: 28 mmol/L (ref 22–32)
Calcium: 8.8 mg/dL — ABNORMAL LOW (ref 8.9–10.3)
Chloride: 103 mmol/L (ref 98–111)
Creatinine, Ser: 0.75 mg/dL (ref 0.44–1.00)
GFR, Estimated: >60 mL/min (ref >60)
Glucose, Bld: 78 mg/dL (ref 70–99)
Potassium: 3.9 mmol/L (ref 3.5–5.1)
Sodium: 140 mmol/L (ref 135–145)
Total Bilirubin: 0.3 mg/dL (ref 0.3–1.2)
Total Protein: 7.6 g/dL (ref 6.5–8.1)

## 2021-08-20 LAB — URINALYSIS, ROUTINE W REFLEX MICROSCOPIC
Bilirubin Urine: NEGATIVE
Glucose, UA: NEGATIVE mg/dL
Hgb urine dipstick: NEGATIVE
Ketones, ur: NEGATIVE mg/dL
Leukocytes,Ua: NEGATIVE
Nitrite: NEGATIVE
Protein, ur: NEGATIVE mg/dL
Specific Gravity, Urine: 1.019 (ref 1.005–1.030)
pH: 5 (ref 5.0–8.0)

## 2021-08-20 LAB — POC URINE PREG, ED: Preg Test, Ur: NEGATIVE

## 2021-08-20 LAB — CBC
HCT: 37.4 % (ref 36.0–46.0)
Hemoglobin: 12.2 g/dL (ref 12.0–15.0)
MCH: 28 pg (ref 26.0–34.0)
MCHC: 32.6 g/dL (ref 30.0–36.0)
MCV: 85.8 fL (ref 80.0–100.0)
Platelets: 284 10*3/uL (ref 150–400)
RBC: 4.36 MIL/uL (ref 3.87–5.11)
RDW: 14.1 % (ref 11.5–15.5)
WBC: 7.8 10*3/uL (ref 4.0–10.5)
nRBC: 0 % (ref 0.0–0.2)

## 2021-08-20 LAB — LIPASE, BLOOD: Lipase: 30 U/L (ref 11–51)

## 2021-08-20 MED ORDER — IOHEXOL 300 MG/ML  SOLN
100.0000 mL | Freq: Once | INTRAMUSCULAR | Status: AC | PRN
  Administered 2021-08-20: 100 mL via INTRAVENOUS
  Filled 2021-08-20: qty 100

## 2021-08-20 NOTE — ED Notes (Signed)
Patient transported to CT 

## 2021-08-20 NOTE — ED Provider Notes (Signed)
? ?[facility] ?Provider Note ? ?Patient Contact: 5:31 PM (approximate) ? ? ?History  ? ?Abdominal Pain ? ? ?HPI ? ?Valerie Massey is a 41 y.o. female presents to the emergency department with severe right lower quadrant abdominal pain for the past 2 to 3 days.  Patient describes it as a burning pain that sometimes radiates along her right side and right upper abdomen.  She is unsure of fever at home.  Denies experiencing similar symptoms in the past.  She still has her appendix.  No chest pain, chest tightness or shortness of breath. ? ?  ? ? ?Physical Exam  ? ?Triage Vital Signs: ?ED Triage Vitals  ?Enc Vitals Group  ?   BP 08/20/21 1625 133/85  ?   Pulse Rate 08/20/21 1625 80  ?   Resp 08/20/21 1625 18  ?   Temp 08/20/21 1625 98.8 ?F (37.1 ?C)  ?   Temp src --   ?   SpO2 08/20/21 1625 98 %  ?   Weight 08/20/21 1650 250 lb (113.4 kg)  ?   Height 08/20/21 1650 '5\' 6"'$  (1.676 m)  ?   Head Circumference --   ?   Peak Flow --   ?   Pain Score 08/20/21 1624 8  ?   Pain Loc --   ?   Pain Edu? --   ?   Excl. in [place]? --   ? ? ?Most recent vital signs: ?Vitals:  ? 08/20/21 1625 08/20/21 1912  ?BP: 133/85 130/80  ?Pulse: 80 75  ?Resp: 18 20  ?Temp: 98.8 ?F (37.1 ?C)   ?SpO2: 98% 98%  ? ? ? ?General: Alert and in no acute distress. ?Eyes:  PERRL. EOMI. ?Head: No acute traumatic findings ?ENT: ?     Ears:  ?     Nose: No congestion/rhinnorhea. ?     Mouth/Throat: Mucous membranes are moist. ?Neck: No stridor. No cervical spine tenderness to palpation. ?Cardiovascular:  Good peripheral perfusion ?Respiratory: Normal respiratory effort without tachypnea or retractions. Lungs CTAB. Good air entry to the bases with no decreased or absent breath sounds. ?Gastrointestinal: Bowel sounds ?4 quadrants.  Patient has right lower quadrant tenderness to palpation.  No guarding or rigidity. No palpable masses. No distention. No CVA tenderness. ?Musculoskeletal: Full range of motion to all extremities.   ?Neurologic:  No gross focal neurologic deficits are appreciated.  ?Skin:   No rash noted ?Other: ? ? ?ED Results / Procedures / Treatments  ? ?Labs ?(all labs ordered are listed, but only abnormal results are displayed) ?Labs Reviewed  ?COMPREHENSIVE METABOLIC PANEL - Abnormal; Notable for the following components:  ?    Result Value  ? Calcium 8.8 (*)   ? All other components within normal limits  ?URINALYSIS, ROUTINE W REFLEX MICROSCOPIC - Abnormal; Notable for the following components:  ? Color, Urine YELLOW (*)   ? APPearance HAZY (*)   ? All other components within normal limits  ?LIPASE, BLOOD  ?CBC  ?POC URINE PREG, ED  ? ? ? ? ? ?RADIOLOGY ? ?I personally viewed and evaluated these images as part of my medical decision making, as well as reviewing the written report by the radiologist. ? ?ED Provider Interpretation: I personally reviewed CT abdomen pelvis and no acute abnormality was visualized.  Agree with radiologist interpretation. ? ? ?PROCEDURES: ? ?Critical Care performed: No ? ?Procedures ? ? ?MEDICATIONS ORDERED IN ED: ?Medications  ?iohexol (OMNIPAQUE) 300 MG/ML solution 100 mL (100 mLs  Intravenous Contrast Given 08/20/21 1743)  ? ? ? ?IMPRESSION / MDM / ASSESSMENT AND PLAN / ED COURSE  ?I reviewed the triage vital signs and the nursing notes. ?             ?               ? ?Assessment and plan:  ?Abdominal pain:  ?Differential diagnosis includes, but is not limited to, appendicitis, mesenteric lymphadenitis, ovarian cyst, nephrolithiasis.... ? ?CBC and CMP within reference range.  Urine pregnancy test was negative.  Urinalysis shows no signs of UTI.  CT abdomen pelvis shows no acute abnormality. ? ?Tylenol and ibuprofen alternating were recommended for discomfort.  Return precautions were given to return with new or worsening symptoms. ?  ? ? ?FINAL CLINICAL IMPRESSION(S) / ED DIAGNOSES  ? ?Final diagnoses:  ?Abdominal pain, unspecified abdominal location  ? ? ? ?Rx / DC Orders  ? ?ED Discharge  Orders   ? ? None  ? ?  ? ? ? ?Note:  This document was prepared using Dragon voice recognition software and may include unintentional dictation errors. ?  ?Lannie Fields, PA-C ?08/20/21 2333 ? ?  ?Harvest Dark, MD ?08/21/21 2023 ? ?

## 2021-08-20 NOTE — ED Triage Notes (Signed)
Pt comes with c/o RLQ pain for few days. Pt states burning pain. Pt denies any N/V//D ?

## 2021-08-28 NOTE — Progress Notes (Incomplete)
? ?  08/29/2021 ? ?CC: No chief complaint on file. ? ? ? ?HPI: ?Valerie Massey is a 41 y.o.female with personal history of bladder lesion status post TURBT who presents today for a surveillance cystoscopy.  ? ?She was found to have a papillary lesion measuring 3 mm.  She was taken to the operating room on 07/26/2019.  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 ? ? ? ? ? ? ? ? ?There were no vitals filed for this visit. ?NED. A&Ox3.   ?No respiratory distress   ?Abd soft, NT, ND ?Normal external genitalia with patent urethral meatus ? ?Cystoscopy Procedure Note ? ?Patient identification was confirmed, informed consent was obtained, and patient was prepped using Betadine solution.  Lidocaine jelly was administered per urethral meatus.   ? ?Procedure: ?- Flexible cystoscope introduced, without any difficulty.   ?- Thorough search of the bladder revealed: ?   normal urethral meatus ?   normal urothelium ?   no stones ?   no ulcers  ?   no tumors ?   no urethral polyps ?   no trabeculation ? ?- Ureteral orifices were normal in position and appearance. ? ?Post-Procedure: ?- Patient tolerated the procedure well ? ?Assessment/ Plan: ? ? ? No follow-ups on file. ? ?I,Kailey Littlejohn,acting as a scribe for Hollice Espy, MD.,have documented all relevant documentation on the behalf of Hollice Espy, MD,as directed by  Hollice Espy, MD while in the presence of Hollice Espy, MD. ?

## 2021-08-29 ENCOUNTER — Other Ambulatory Visit: Payer: BC Managed Care – PPO | Admitting: Urology

## 2021-09-14 ENCOUNTER — Ambulatory Visit
Admission: RE | Admit: 2021-09-14 | Discharge: 2021-09-14 | Disposition: A | Discharge status: Home / Self Care | Payer: BC Managed Care – PPO | Source: Ambulatory Visit | Attending: Internal Medicine | Admitting: Internal Medicine

## 2021-09-14 DIAGNOSIS — Z1231 Encounter for screening mammogram for malignant neoplasm of breast: Secondary | ICD-10-CM | POA: Insufficient documentation

## 2022-02-09 ENCOUNTER — Encounter: Payer: Self-pay | Admitting: Emergency Medicine

## 2022-02-09 ENCOUNTER — Ambulatory Visit
Admission: EM | Admit: 2022-02-09 | Discharge: 2022-02-09 | Disposition: A | Discharge status: Home / Self Care | Payer: BC Managed Care – PPO | Attending: Emergency Medicine | Admitting: Emergency Medicine

## 2022-02-09 ENCOUNTER — Ambulatory Visit (INDEPENDENT_AMBULATORY_CARE_PROVIDER_SITE_OTHER): Payer: BC Managed Care – PPO

## 2022-02-09 DIAGNOSIS — G43909 Migraine, unspecified, not intractable, without status migrainosus: Secondary | ICD-10-CM | POA: Diagnosis not present

## 2022-02-09 DIAGNOSIS — D649 Anemia, unspecified: Secondary | ICD-10-CM | POA: Insufficient documentation

## 2022-02-09 DIAGNOSIS — F32A Depression, unspecified: Secondary | ICD-10-CM | POA: Diagnosis not present

## 2022-02-09 DIAGNOSIS — Z7951 Long term (current) use of inhaled steroids: Secondary | ICD-10-CM | POA: Diagnosis not present

## 2022-02-09 DIAGNOSIS — K219 Gastro-esophageal reflux disease without esophagitis: Secondary | ICD-10-CM | POA: Diagnosis not present

## 2022-02-09 DIAGNOSIS — K760 Fatty (change of) liver, not elsewhere classified: Secondary | ICD-10-CM | POA: Diagnosis not present

## 2022-02-09 DIAGNOSIS — Z720 Tobacco use: Secondary | ICD-10-CM | POA: Insufficient documentation

## 2022-02-09 DIAGNOSIS — Z20822 Contact with and (suspected) exposure to covid-19: Secondary | ICD-10-CM | POA: Insufficient documentation

## 2022-02-09 DIAGNOSIS — R051 Acute cough: Secondary | ICD-10-CM | POA: Insufficient documentation

## 2022-02-09 DIAGNOSIS — R0602 Shortness of breath: Secondary | ICD-10-CM | POA: Diagnosis not present

## 2022-02-09 DIAGNOSIS — R519 Headache, unspecified: Secondary | ICD-10-CM | POA: Diagnosis not present

## 2022-02-09 DIAGNOSIS — J209 Acute bronchitis, unspecified: Secondary | ICD-10-CM | POA: Diagnosis not present

## 2022-02-09 LAB — RESP PANEL BY RT-PCR (RSV, FLU A&B, COVID)  RVPGX2
Influenza A by PCR: NEGATIVE
Influenza B by PCR: NEGATIVE
Resp Syncytial Virus by PCR: NEGATIVE
SARS Coronavirus 2 by RT PCR: NEGATIVE

## 2022-02-09 MED ORDER — ALBUTEROL SULFATE (2.5 MG/3ML) 0.083% IN NEBU
2.5000 mg | INHALATION_SOLUTION | Freq: Once | RESPIRATORY_TRACT | Status: AC
  Administered 2022-02-09: 2.5 mg via RESPIRATORY_TRACT

## 2022-02-09 MED ORDER — AZITHROMYCIN 250 MG PO TABS: 250.0000 mg | ORAL_TABLET | Freq: Every day | ORAL | 0 refills | Status: AC

## 2022-02-09 MED ORDER — PREDNISONE 10 MG PO TABS: 40.0000 mg | ORAL_TABLET | Freq: Every day | ORAL | 0 refills | Status: AC

## 2022-02-09 NOTE — Discharge Instructions (Addendum)
Go to the emergency department if you have acute shortness of breath, worsening headache, or other concerning symptoms.    Follow up with your primary care provider on Monday.    Continue the albuterol inhaler.  Take the prednisone and Zithromax as directed.    YourCOVID, Flu, and RSV tests are pending.

## 2022-02-09 NOTE — ED Triage Notes (Addendum)
Pt reports shortness of breath and head pressure since this morning. Tried using an inhaler with no relief. Denies known history of asthma and COPD. O2 saturation 97% on room air. Reports SOB gets worse with exertion. Hx of Bronchitis

## 2022-02-09 NOTE — ED Provider Notes (Signed)
[facility]    CSN: [id number] Arrival date & time: 02/09/22  1409      History   Chief Complaint Chief Complaint  Patient presents with   Shortness of Breath   Headache    HPI Valerie Massey is a 41 y.o. female.  Patient presents with shortness of breath and headache since this morning.  No falls or trauma.  She also reports mild cough.  The headache feels like "pressure" on both temples, "squeezing", 5/10, not the worst headache of her life.  Treating symptoms at home with albuterol inhaler and ibuprofen.  Patient reports the albuterol inhaler was prescribed for seasonal allergies.  She denies history of asthma or COPD.  She denies fever, chills, rash, sore throat, ear pain, chest pain, vomiting, diarrhea, or other symptoms.  Her medical history includes bronchitis, migraine headaches, GERD, fatty liver, anemia, depression.  Current everyday smoker.  The history is provided by the patient and medical records.    Past Medical History:  Diagnosis Date   Anemia    BRCA negative 2015   MyRisk neg   Bronchitis    Depression    Family history of breast cancer 2015   IBIS=14.1%   Family history of ovarian cancer    Fatty liver    GERD (gastroesophageal reflux disease)    History of hiatal hernia    Migraines     Patient Active Problem List   Diagnosis Date Noted   Endometrial polyp 02/10/2018   Menorrhagia with irregular cycle 01/28/2018    Past Surgical History:  Procedure Laterality Date   ABDOMINAL HYSTERECTOMY     CYSTOSCOPY N/A 06/15/2019   Procedure: CYSTOSCOPY;  Surgeon: Will Bonnet, MD;  Location: [facility];  Service: Gynecology;  Laterality: N/A;   CYSTOSCOPY W/ RETROGRADES Bilateral 07/26/2019   Procedure: CYSTOSCOPY WITH RETROGRADE PYELOGRAM;  Surgeon: Hollice Espy, MD;  Location: [facility];  Service: Urology;  Laterality: Bilateral;   DILATATION & CURETTAGE/HYSTEROSCOPY WITH MYOSURE N/A 03/12/2018   Procedure: DILATATION &  CURETTAGE/HYSTEROSCOPY WITH MYOSURE;  Surgeon: Will Bonnet, MD;  Location: [facility];  Service: Gynecology;  Laterality: N/A;   DILATION AND CURETTAGE OF UTERUS     PARTIAL HYSTERECTOMY     TOTAL LAPAROSCOPIC HYSTERECTOMY WITH SALPINGECTOMY N/A 06/15/2019   Procedure: TOTAL LAPAROSCOPIC HYSTERECTOMY WITH BILATERAL SALPINGECTOMY;  Surgeon: Will Bonnet, MD;  Location: [facility];  Service: Gynecology;  Laterality: N/A;   TRANSURETHRAL RESECTION OF BLADDER TUMOR WITH MITOMYCIN-C N/A 07/26/2019   Procedure: 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 Authorizing Provider  azithromycin (ZITHROMAX) 250 MG tablet Take 1 tablet (250 mg total) by mouth daily. Take first 2 tablets together, then 1 every day until finished. 02/09/22  Yes Sharion Balloon, NP  predniSONE (DELTASONE) 10 MG tablet Take 4 tablets (40 mg total)  by mouth daily for 5 days. 02/09/22 02/14/22 Yes Sharion Balloon, NP  acetaminophen (TYLENOL) 500 MG tablet Take 500 mg by mouth every 6 (six) hours as needed (for pain.).    [provider]  albuterol (VENTOLIN HFA) 108 (90 Base) MCG/ACT inhaler Inhale 2 puffs into the lungs every 6 (six) hours as needed. 10/21/19   [provider]  buPROPion (WELLBUTRIN XL) 150 MG 24 hr tablet Take 1 tablet (150 mg total) by mouth daily. 05/01/21   Gae Dry, MD  glipiZIDE (GLUCOTROL XL) 2.5 MG 24 hr tablet TAKE 1 TABLET(2.5 MG) BY MOUTH EVERY DAY 11/23/19   [provider]  lisinopril (ZESTRIL) 5 MG tablet Take by mouth. 12/30/19 12/29/20  [provider]  LORazepam (ATIVAN) 0.5 MG tablet Take 1 tablet (0.5 mg total) by mouth every 12 (twelve) hours as needed for anxiety. 01/31/20   Will Bonnet, MD  losartan (COZAAR) 25 MG tablet Take 25 mg by mouth daily. 08/14/20   [provider]  Multiple Vitamin (MULTIVITAMIN WITH MINERALS) TABS tablet Take 1 tablet by mouth daily.     [provider]  omeprazole (PRILOSEC) 20 MG capsule Take 20 mg by mouth 2 (two) times daily.    [provider]  ondansetron (ZOFRAN-ODT) 4 MG disintegrating tablet Take 1 tablet (4 mg total) by mouth every 8 (eight) hours as needed. 06/13/21   Vladimir Crofts, MD  oxybutynin (DITROPAN) 5 MG tablet Take 1 tablet (5 mg total) by mouth every 8 (eight) hours as needed for bladder spasms. 07/26/19   Hollice Espy, MD  sertraline (ZOLOFT) 100 MG tablet Take 1 tablet (100 mg total) by mouth daily. 01/12/21   Will Bonnet, MD  sodium chloride (OCEAN) 0.65 % SOLN nasal spray Place 1-2 sprays into both nostrils 4 (four) times daily as needed for congestion.    [provider]    Family History Family History  Problem Relation Age of Onset   Breast cancer Mother 72   Ovarian cancer Mother 32   Heart attack Mother    Endometrial cancer Maternal Grandmother 51   Lung cancer Maternal Grandfather 54   Lung cancer Paternal Grandfather 26   Bladder Cancer Cousin     Social History Social History   Tobacco Use   Smoking status: Every Day    Packs/day: 0.50    Types: Cigarettes    Start date: 05/06/1997   Smokeless tobacco: Never  Vaping Use   Vaping Use: Never used  Substance Use Topics   Alcohol use: Yes    Comment: 1 q month   Drug use: Never     Allergies   Bee venom, Other, and Tamiflu [oseltamivir phosphate]   Review of Systems Review of Systems  Constitutional:  Negative for chills and fever.  HENT:  Negative for ear pain and sore throat.   Respiratory:  Positive for cough and shortness of breath.   Cardiovascular:   Negative for chest pain and palpitations.  Gastrointestinal:  Negative for abdominal pain, diarrhea and vomiting.  Skin:  Negative for color change and rash.  Neurological:  Positive for headaches. Negative for dizziness, syncope, facial asymmetry, speech difficulty, weakness, light-headedness and numbness.  All other systems reviewed and are negative.    Physical Exam Triage Vital Signs ED Triage Vitals  Enc Vitals Group     BP 02/09/22 1416 130/76     Pulse Rate 02/09/22 1416 89     Resp 02/09/22 1416 18  Temp 02/09/22 1416 98.4 F (36.9 C)     Temp src --      SpO2 02/09/22 1416 97 %     Weight --      Height --      Head Circumference --      Peak Flow --      Pain Score 02/09/22 1426 5     Pain Loc --      Pain Edu? --      Excl. in [place]? --    No data found.  Updated Vital Signs BP 130/76   Pulse 89   Temp 98.4 F (36.9 C)   Resp 18   LMP 03/12/2019 (Exact Date)   SpO2 97%   Visual Acuity Right Eye Distance:   Left Eye Distance:   Bilateral Distance:    Right Eye Near:   Left Eye Near:    Bilateral Near:     Physical Exam Vitals and nursing note reviewed.  Constitutional:      General: She is not in acute distress.    Appearance: She is well-developed. She is obese. She is not ill-appearing.  HENT:     Right Ear: Tympanic membrane normal.     Left Ear: Tympanic membrane normal.     Nose: Nose normal.     Mouth/Throat:     Mouth: Mucous membranes are moist.     Pharynx: Oropharynx is clear.  Eyes:     Pupils: Pupils are equal, round, and reactive to light.  Cardiovascular:     Rate and Rhythm: Normal rate and regular rhythm.     Heart sounds: Normal heart sounds.  Pulmonary:     Effort: Pulmonary effort is normal. No respiratory distress.     Breath sounds: Normal breath sounds.  Musculoskeletal:     Cervical back: Neck supple.     Right lower leg: No edema.     Left lower leg: No edema.  Skin:    General: Skin is warm and dry.   Neurological:     General: No focal deficit present.     Mental Status: She is alert and oriented to person, place, and time.     Sensory: No sensory deficit.     Motor: No weakness.     Gait: Gait normal.  Psychiatric:        Mood and Affect: Mood normal.        Behavior: Behavior normal.      UC Treatments / Results  Labs (all labs ordered are listed, but only abnormal results are displayed) Labs Reviewed  RESP PANEL BY RT-PCR (RSV, FLU A&B, COVID)  RVPGX2    EKG   Radiology DG Chest 2 View  Result Date: 02/09/2022 CLINICAL DATA:  Shortness of breath, cough EXAM: CHEST - 2 VIEW COMPARISON:  01/21/2021 FINDINGS: Cardiac size is within normal limits. There are no signs of pulmonary edema or focal pulmonary consolidation. Small transverse linear density in right mid lung fields suggests scarring or subsegmental atelectasis. This finding was not seen in the previous study. There is no pleural effusion or pneumothorax. IMPRESSION: There are no signs of pulmonary edema or focal pulmonary consolidation. Small transverse linear density in the right mid lung fields may suggest scarring or subsegmental atelectasis. Electronically Signed   By: Elmer Picker M.D.   On: 02/09/2022 14:59    Procedures Procedures (including critical care time)  Medications Ordered in UC Medications  albuterol (PROVENTIL) (2.5 MG/3ML) 0.083% nebulizer solution 2.5 mg (2.5 mg  Nebulization Given 02/09/22 1509)    Initial Impression / Assessment and Plan / UC Course  I have reviewed the triage vital signs and the nursing notes.  Pertinent labs & imaging results that were available during my care of the patient were reviewed by me and considered in my medical decision making (see chart for details).   Shortness of breath, cough, headache, acute bronchitis.   Albuterol neb treatment given here.  Improved air movement and decreased shortness of breath.  CXR shows "Small transverse linear density in the  right mid lung fields may suggest scarring or subsegmental atelectasis."  COVID, Flu, RSV pending.  Instructed patient to continue using albuterol inhaler.  Treating with prednisone and Zithromax.  Instructed patient to follow-up with her PCP on Monday.  ED precautions discussed.  Education provided on shortness of breath, cough, headache, acute bronchitis.  Patient agrees to plan of care.   Final Clinical Impressions(s) / UC Diagnoses   Final diagnoses:  Shortness of breath  Acute cough  Acute nonintractable headache, unspecified headache type  Acute bronchitis, unspecified organism     Discharge Instructions      Go to the emergency department if you have acute shortness of breath, worsening headache, or other concerning symptoms.    Follow up with your primary care provider on Monday.    Continue the albuterol inhaler.  Take the prednisone and Zithromax as directed.    YourCOVID, Flu, and RSV tests are pending.        ED Prescriptions     Medication Sig Dispense Auth. Provider   predniSONE (DELTASONE) 10 MG tablet Take 4 tablets (40 mg total) by mouth daily for 5 days. 20 tablet Sharion Balloon, NP   azithromycin (ZITHROMAX) 250 MG tablet Take 1 tablet (250 mg total) by mouth daily. Take first 2 tablets together, then 1 every day until finished. 6 tablet Sharion Balloon, NP      PDMP not reviewed this encounter.   Sharion Balloon, NP 02/09/22 1520

## 2022-07-14 ENCOUNTER — Other Ambulatory Visit: Payer: Self-pay

## 2022-07-14 ENCOUNTER — Emergency Department
Admission: EM | Admit: 2022-07-14 | Discharge: 2022-07-14 | Disposition: A | Discharge status: Home / Self Care | Payer: Self-pay | Attending: Emergency Medicine | Admitting: Emergency Medicine

## 2022-07-14 ENCOUNTER — Encounter: Payer: Self-pay | Admitting: Emergency Medicine

## 2022-07-14 ENCOUNTER — Emergency Department: Payer: Self-pay

## 2022-07-14 DIAGNOSIS — D72829 Elevated white blood cell count, unspecified: Secondary | ICD-10-CM | POA: Insufficient documentation

## 2022-07-14 DIAGNOSIS — R112 Nausea with vomiting, unspecified: Secondary | ICD-10-CM | POA: Insufficient documentation

## 2022-07-14 DIAGNOSIS — R1013 Epigastric pain: Secondary | ICD-10-CM | POA: Insufficient documentation

## 2022-07-14 LAB — CBC
HCT: 40.3 % (ref 36.0–46.0)
Hemoglobin: 13.3 g/dL (ref 12.0–15.0)
MCH: 27.7 pg (ref 26.0–34.0)
MCHC: 33 g/dL (ref 30.0–36.0)
MCV: 83.8 fL (ref 80.0–100.0)
Platelets: 318 10*3/uL (ref 150–400)
RBC: 4.81 MIL/uL (ref 3.87–5.11)
RDW: 14.4 % (ref 11.5–15.5)
WBC: 14.6 10*3/uL — ABNORMAL HIGH (ref 4.0–10.5)
nRBC: 0 % (ref 0.0–0.2)

## 2022-07-14 LAB — COMPREHENSIVE METABOLIC PANEL
ALT: 36 U/L (ref 0–44)
AST: 39 U/L (ref 15–41)
Albumin: 3.9 g/dL (ref 3.5–5.0)
Alkaline Phosphatase: 77 U/L (ref 38–126)
Anion gap: 10 (ref 5–15)
BUN: 11 mg/dL (ref 6–20)
CO2: 23 mmol/L (ref 22–32)
Calcium: 8.8 mg/dL — ABNORMAL LOW (ref 8.9–10.3)
Chloride: 105 mmol/L (ref 98–111)
Creatinine, Ser: 0.89 mg/dL (ref 0.44–1.00)
GFR, Estimated: >60 mL/min (ref >60)
Glucose, Bld: 167 mg/dL — ABNORMAL HIGH (ref 70–99)
Potassium: 3.4 mmol/L — ABNORMAL LOW (ref 3.5–5.1)
Sodium: 138 mmol/L (ref 135–145)
Total Bilirubin: 0.7 mg/dL (ref 0.3–1.2)
Total Protein: 8 g/dL (ref 6.5–8.1)

## 2022-07-14 LAB — LIPASE, BLOOD: Lipase: 29 U/L (ref 11–51)

## 2022-07-14 MED ORDER — ONDANSETRON HCL 4 MG/2ML IJ SOLN
4.0000 mg | Freq: Once | INTRAMUSCULAR | Status: AC
  Administered 2022-07-14: 4 mg via INTRAVENOUS
  Filled 2022-07-14: qty 2

## 2022-07-14 MED ORDER — ONDANSETRON HCL 4 MG PO TABS: 4.0000 mg | ORAL_TABLET | Freq: Every day | ORAL | 1 refills | Status: AC | PRN

## 2022-07-14 MED ORDER — IOHEXOL 300 MG/ML  SOLN
100.0000 mL | Freq: Once | INTRAMUSCULAR | Status: AC | PRN
  Administered 2022-07-14: 100 mL via INTRAVENOUS

## 2022-07-14 MED ORDER — OMEPRAZOLE 20 MG PO CPDR: 20.0000 mg | DELAYED_RELEASE_CAPSULE | Freq: Two times a day (BID) | ORAL | 2 refills | Status: AC

## 2022-07-14 MED ORDER — MORPHINE SULFATE (PF) 4 MG/ML IV SOLN
4.0000 mg | Freq: Once | INTRAVENOUS | Status: AC
  Administered 2022-07-14: 4 mg via INTRAVENOUS
  Filled 2022-07-14: qty 1

## 2022-07-14 MED ORDER — FAMOTIDINE IN NACL 20-0.9 MG/50ML-% IV SOLN
20.0000 mg | Freq: Once | INTRAVENOUS | Status: AC
  Administered 2022-07-14: 20 mg via INTRAVENOUS
  Filled 2022-07-14: qty 50

## 2022-07-14 MED ORDER — LACTATED RINGERS IV BOLUS
1000.0000 mL | Freq: Once | INTRAVENOUS | Status: AC
  Administered 2022-07-14: 1000 mL via INTRAVENOUS

## 2022-07-14 NOTE — ED Provider Notes (Addendum)
CT scan without surgical pathology but shows cystitis versus bladder nondistention--   Patient without urinary symptoms and only right upper quadrant and epigastric pain, so I doubt urinary tract infection as etiology. Hepatic steatosis and prominent portal veins ?portal HTN. GI referral written -  pt to follow up. She is already on omeprazole.  I prescribed her Zofran.  I gave her return precautions for any new or worsening symptoms.   Lucillie Garfinkel, MD 07/14/22 [id number]    Lucillie Garfinkel, MD 07/14/22 [phone]

## 2022-07-14 NOTE — ED Triage Notes (Signed)
Pt arrived via POV with reports of 3 episodes of vomiting, generalized abd pain and weakness.  Pt also c/o HA.

## 2022-07-14 NOTE — ED Provider Notes (Signed)
[facility] Provider Note    Event Date/Time   First MD Initiated Contact with Patient 07/14/22 [phone]     (approximate)   History   Emesis and Abdominal Pain   HPI  Valerie Massey is a 42 y.o. female who presents to the ED for evaluation of Emesis and Abdominal Pain   Patient presents to the ED for evaluation of epigastric discomfort and emesis.  Symptoms started this morning the past couple hours.  She reports that she was at baseline last night which went to bed.  Obese patient with history of hysterectomy and bladder tumor removal.  No other intra-abdominal surgical history.  No recent illnesses.   Physical Exam   Triage Vital Signs: ED Triage Vitals  Enc Vitals Group     BP 07/14/22 0543 125/78     Pulse Rate 07/14/22 0543 91     Resp 07/14/22 0543 18     Temp 07/14/22 0543 98.1 F (36.7 C)     Temp Source 07/14/22 0543 Oral     SpO2 07/14/22 0543 93 %     Weight 07/14/22 0542 250 lb (113.4 kg)     Height 07/14/22 0542 '5\' 6"'$  (1.676 m)     Head Circumference --      Peak Flow --      Pain Score 07/14/22 0541 5     Pain Loc --      Pain Edu? --      Excl. in [place]? --     Most recent vital signs: Vitals:   07/14/22 0543  BP: 125/78  Pulse: 91  Resp: 18  Temp: 98.1 F (36.7 C)  SpO2: 93%    General: Awake, no distress.  Obese.  Seems uncomfortable. CV:  Good peripheral perfusion.  Resp:  Normal effort.  Abd:  No distention.  Epigastric and RUQ tenderness is present.  Lower abdomen and left-sided abdomen is benign. MSK:  No deformity noted.  Neuro:  No focal deficits appreciated. Other:     ED Results / Procedures / Treatments   Labs (all labs ordered are listed, but only abnormal results are displayed) Labs Reviewed  COMPREHENSIVE METABOLIC PANEL - Abnormal; Notable for the following components:      Result Value   Potassium 3.4 (*)    Glucose, Bld 167 (*)    Calcium 8.8 (*)    All other components within normal  limits  CBC - Abnormal; Notable for the following components:   WBC 14.6 (*)    All other components within normal limits  LIPASE, BLOOD  URINALYSIS, ROUTINE W REFLEX MICROSCOPIC    EKG Sinus rhythm with a rate of 89 bpm.  Normal axis and intervals.  Nonspecific ST changes laterally without clear acute ischemic features.  RADIOLOGY RUQ ultrasound interpreted by me without evidence of gallstones or cholecystitis  Official radiology report(s): US ABDOMEN LIMITED RUQ (LIVER/GB)  Result Date: 07/14/2022 CLINICAL DATA:  42 year old female with right upper quadrant pain and vomiting. EXAM: ULTRASOUND ABDOMEN LIMITED RIGHT UPPER QUADRANT COMPARISON:  CT Abdomen and Pelvis 08/20/2021. FINDINGS: Gallbladder: No gallstones or wall thickening visualized. No sonographic Murphy sign noted by sonographer. Common bile duct: Diameter: 4 mm, normal. Liver: Echogenic liver (image 30) difficult to penetrate in areas, and evidence of hepatic steatosis by CT last year also. No discrete liver lesion. Portal vein is patent on color Doppler imaging with normal direction of blood flow towards the liver. Other: Negative visible right kidney.  No free fluid.  IMPRESSION: 1. Chronic fatty liver disease. 2. Negative gallbladder and bile ducts. Electronically Signed   By: Genevie Ann M.D.   On: 07/14/2022 06:48    PROCEDURES and INTERVENTIONS:  .1-3 Lead EKG Interpretation  Performed by: Vladimir Crofts, MD Authorized by: Vladimir Crofts, MD     Interpretation: normal     ECG rate:  88   ECG rate assessment: normal     Rhythm: sinus rhythm     Ectopy: none     Conduction: normal     Medications  famotidine (PEPCID) IVPB 20 mg premix (has no administration in time range)  lactated ringers bolus 1,000 mL (1,000 mLs Intravenous New Bag/Given 07/14/22 0612)  ondansetron (ZOFRAN) injection 4 mg (4 mg Intravenous Given 07/14/22 0610)  morphine (PF) 4 MG/ML injection 4 mg (4 mg Intravenous Given 07/14/22 0609)     IMPRESSION  / MDM / ASSESSMENT AND PLAN / ED COURSE  I reviewed the triage vital signs and the nursing notes.  Differential diagnosis includes, but is not limited to, cholelithiasis, choledocholithiasis, gastritis, SBO, pancreatitis  {Patient presents with symptoms of an acute illness or injury that is potentially life-threatening.  Obese 42 year old woman presents with epigastric pain and emesis.  Epigastric and RUQ tenderness is present without peritoneal features.  Lower abdomen is benign.  Blood work with nonspecific leukocytosis.  Normal lipase and metabolic panel.  RUQ ultrasound obtained due to high suspicion clinically for biliary colic, but no signs of cholelithiasis.  Due to her leukocytosis and continued symptoms we will obtain a CT abdomen/pelvis.  She is signed out to oncoming provider      FINAL CLINICAL IMPRESSION(S) / ED DIAGNOSES   Final diagnoses:  Nausea and vomiting, unspecified vomiting type  Epigastric pain     Rx / DC Orders   ED Discharge Orders     None        Note:  This document was prepared using Dragon voice recognition software and may include unintentional dictation errors.   Vladimir Crofts, MD 07/14/22 [phone]

## 2022-07-14 NOTE — Discharge Instructions (Addendum)
Continue taking omeprazole as you have been doing.  Take Zofran for nausea as needed.  I made a referral to gastroenterology to further assess your abdominal pain.  Thank you for choosing [place] for your health care today!  Please see your primary doctor this week for a follow up appointment.   Sometimes, in the early stages of certain disease courses it is difficult to detect in the emergency department evaluation -- so, it is important that you continue to monitor your symptoms and call your doctor right away or return to the emergency department if you develop any new or worsening symptoms.  Please go to the following website to schedule new (and existing) patient appointments:   [email or web address]  If you do not have a primary doctor try calling the following clinics to establish care:  If you have insurance:  [facility] [phone] [place] [address]   [facility]  [phone] [place] [place] [address]   If you do not have insurance:  [facility]  [phone] [address], [place] [address]   The following is another list of primary care offices in the area who are accepting new patients at this time.  Please reach out to one of them directly and let them know you would like to schedule an appointment to follow up on an Emergency Department visit, and/or to establish a new primary care provider (PCP).  There are likely other primary care clinics in the are who are accepting new patients, but this is an excellent place to start:  [facility] physician: Dr Lavon Paganini [address] [place], [place] [address] [phone]  [facility] Lead Physician: Dr Steele Sizer [address], [place], [place] [address] [phone]  [place] Physician: Dr Park Liter [address] [place], [place]  [address] [phone]  [facility] Lead Physician: Dr Dewaine Oats [place], [place], [place] [address] [phone]  [facility] [place] Physician: Dr Halina Maidens [address] Broach [place], [place] [address] [phone]   It was my pleasure to care for you today.   [place] Brunette Jacelyn Grip, MD

## 2022-07-14 NOTE — ED Notes (Signed)
US at bedside

## 2022-10-25 ENCOUNTER — Other Ambulatory Visit: Payer: Self-pay

## 2022-10-25 ENCOUNTER — Ambulatory Visit
Admission: RE | Admit: 2022-10-25 | Discharge: 2022-10-25 | Disposition: A | Discharge status: Home / Self Care | Payer: Self-pay | Source: Ambulatory Visit | Attending: Family Medicine | Admitting: Family Medicine

## 2022-10-25 DIAGNOSIS — M7989 Other specified soft tissue disorders: Secondary | ICD-10-CM

## 2022-11-30 ENCOUNTER — Other Ambulatory Visit: Payer: Self-pay

## 2022-11-30 ENCOUNTER — Emergency Department: Payer: Self-pay

## 2022-11-30 ENCOUNTER — Emergency Department
Admission: EM | Admit: 2022-11-30 | Discharge: 2022-11-30 | Disposition: A | Discharge status: Home / Self Care | Payer: Self-pay | Attending: Emergency Medicine | Admitting: Emergency Medicine

## 2022-11-30 DIAGNOSIS — R1011 Right upper quadrant pain: Secondary | ICD-10-CM | POA: Insufficient documentation

## 2022-11-30 DIAGNOSIS — Z79899 Other long term (current) drug therapy: Secondary | ICD-10-CM | POA: Insufficient documentation

## 2022-11-30 DIAGNOSIS — Z7984 Long term (current) use of oral hypoglycemic drugs: Secondary | ICD-10-CM | POA: Insufficient documentation

## 2022-11-30 DIAGNOSIS — R079 Chest pain, unspecified: Secondary | ICD-10-CM

## 2022-11-30 DIAGNOSIS — K219 Gastro-esophageal reflux disease without esophagitis: Secondary | ICD-10-CM | POA: Insufficient documentation

## 2022-11-30 LAB — BASIC METABOLIC PANEL
Anion gap: 8 (ref 5–15)
BUN: 11 mg/dL (ref 6–20)
CO2: 25 mmol/L (ref 22–32)
Calcium: 8.8 mg/dL — ABNORMAL LOW (ref 8.9–10.3)
Chloride: 103 mmol/L (ref 98–111)
Creatinine, Ser: 0.82 mg/dL (ref 0.44–1.00)
GFR, Estimated: >60 mL/min (ref >60)
Glucose, Bld: 170 mg/dL — ABNORMAL HIGH (ref 70–99)
Potassium: 3.9 mmol/L (ref 3.5–5.1)
Sodium: 136 mmol/L (ref 135–145)

## 2022-11-30 LAB — TROPONIN I (HIGH SENSITIVITY)
Troponin I (High Sensitivity): 3 ng/L (ref <18)
Troponin I (High Sensitivity): 3 ng/L (ref <18)

## 2022-11-30 LAB — CBC
HCT: 38.1 % (ref 36.0–46.0)
Hemoglobin: 12.6 g/dL (ref 12.0–15.0)
MCH: 28.1 pg (ref 26.0–34.0)
MCHC: 33.1 g/dL (ref 30.0–36.0)
MCV: 84.9 fL (ref 80.0–100.0)
Platelets: 266 10*3/uL (ref 150–400)
RBC: 4.49 MIL/uL (ref 3.87–5.11)
RDW: 13.5 % (ref 11.5–15.5)
WBC: 7 10*3/uL (ref 4.0–10.5)
nRBC: 0 % (ref 0.0–0.2)

## 2022-11-30 LAB — HEPATIC FUNCTION PANEL
ALT: 26 U/L (ref 0–44)
AST: 28 U/L (ref 15–41)
Albumin: 3.8 g/dL (ref 3.5–5.0)
Alkaline Phosphatase: 75 U/L (ref 38–126)
Bilirubin, Direct: 0.2 mg/dL (ref 0.0–0.2)
Indirect Bilirubin: 0.2 mg/dL — ABNORMAL LOW (ref 0.3–0.9)
Total Bilirubin: 0.4 mg/dL (ref 0.3–1.2)
Total Protein: 8.1 g/dL (ref 6.5–8.1)

## 2022-11-30 LAB — LIPASE, BLOOD: Lipase: 44 U/L (ref 11–51)

## 2022-11-30 LAB — D-DIMER, QUANTITATIVE: D-Dimer, Quant: 0.47 ug/mL-FEU (ref 0.00–0.50)

## 2022-11-30 MED ORDER — FAMOTIDINE 20 MG PO TABS: 20.0000 mg | ORAL_TABLET | Freq: Two times a day (BID) | ORAL | 0 refills | Status: AC

## 2022-11-30 MED ORDER — ONDANSETRON HCL 4 MG/2ML IJ SOLN
4.0000 mg | Freq: Once | INTRAMUSCULAR | Status: AC
  Administered 2022-11-30: 4 mg via INTRAVENOUS
  Filled 2022-11-30: qty 2

## 2022-11-30 MED ORDER — SUCRALFATE 1 GM/10ML PO SUSP: 1.0000 g | Freq: Four times a day (QID) | ORAL | 1 refills | Status: AC

## 2022-11-30 MED ORDER — SODIUM CHLORIDE 0.9 % IV BOLUS
500.0000 mL | Freq: Once | INTRAVENOUS | Status: AC
  Administered 2022-11-30: 500 mL via INTRAVENOUS

## 2022-11-30 MED ORDER — FAMOTIDINE IN NACL 20-0.9 MG/50ML-% IV SOLN
20.0000 mg | Freq: Once | INTRAVENOUS | Status: AC
  Administered 2022-11-30: 20 mg via INTRAVENOUS
  Filled 2022-11-30: qty 50

## 2022-11-30 MED ORDER — HYDROMORPHONE HCL 1 MG/ML IJ SOLN
0.5000 mg | Freq: Once | INTRAMUSCULAR | Status: AC
  Administered 2022-11-30: 0.5 mg via INTRAVENOUS
  Filled 2022-11-30: qty 0.5

## 2022-11-30 NOTE — ED Provider Notes (Signed)
[facility] Provider Note    Event Date/Time   First MD Initiated Contact with Patient 11/30/22 0126     (approximate)   History   Chest Pain   HPI  Valerie Massey is a 42 y.o. female who presents to the ED from home with a chief complaint of pain under her right breast which began after eating lunch around noon.  Pain radiating towards right flank.  Slightly improved with taking a muscle relaxer.  Denies fever/chills, cough, shortness of breath, nausea/vomiting or dizziness.  Denies OCP use or recent long travel.     Past Medical History   Past Medical History:  Diagnosis Date   Anemia    BRCA negative 2015   MyRisk neg   Bronchitis    Depression    Family history of breast cancer 2015   IBIS=14.1%   Family history of ovarian cancer    Fatty liver    GERD (gastroesophageal reflux disease)    History of hiatal hernia    Migraines      Active Problem List   Patient Active Problem List   Diagnosis Date Noted   Endometrial polyp 02/10/2018   Menorrhagia with irregular cycle 01/28/2018     Past Surgical History   Past Surgical History:  Procedure Laterality Date   ABDOMINAL HYSTERECTOMY     CYSTOSCOPY N/A 06/15/2019   Procedure: CYSTOSCOPY;  Surgeon: Conard Novak, MD;  Location: [facility];  Service: Gynecology;  Laterality: N/A;   CYSTOSCOPY W/ RETROGRADES Bilateral 07/26/2019   Procedure: CYSTOSCOPY WITH RETROGRADE PYELOGRAM;  Surgeon: Vanna Scotland, MD;  Location: [facility];  Service: Urology;  Laterality: Bilateral;   DILATATION & CURETTAGE/HYSTEROSCOPY WITH MYOSURE N/A 03/12/2018   Procedure: DILATATION & CURETTAGE/HYSTEROSCOPY WITH MYOSURE;  Surgeon: Conard Novak, MD;  Location: [facility];  Service: Gynecology;  Laterality: N/A;   DILATION AND CURETTAGE OF UTERUS     PARTIAL HYSTERECTOMY     TOTAL LAPAROSCOPIC HYSTERECTOMY WITH SALPINGECTOMY N/A 06/15/2019   Procedure: TOTAL LAPAROSCOPIC HYSTERECTOMY WITH BILATERAL  SALPINGECTOMY;  Surgeon: Conard Novak, MD;  Location: [facility];  Service: Gynecology;  Laterality: N/A;   TRANSURETHRAL RESECTION OF BLADDER TUMOR WITH MITOMYCIN-C N/A 07/26/2019   Procedure: 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 Authorizing Provider  buPROPion (WELLBUTRIN XL) 150 MG 24 hr tablet Take 1 tablet (150 mg total) by mouth daily. 05/01/21  Yes Nadara Mustard, MD  famotidine (PEPCID) 20 MG tablet Take 1 tablet (20 mg total) by mouth 2 (two) times daily. 11/30/22  Yes Irean Hong, MD  glipiZIDE (GLUCOTROL XL) 2.5 MG 24 hr tablet TAKE 1 TABLET(2.5 MG) BY MOUTH EVERY DAY 11/23/19  Yes [provider]  lisinopril (ZESTRIL) 5 MG tablet Take by mouth. 12/30/19 11/30/22 Yes [provider]  losartan (COZAAR) 25 MG tablet Take 25 mg by mouth daily. 08/14/20  Yes [provider]  Multiple Vitamin (MULTIVITAMIN WITH MINERALS) TABS tablet Take 1 tablet by mouth daily.    Yes [provider]  omeprazole (PRILOSEC) 20 MG capsule Take 1 capsule (20 mg total) by mouth 2 (two) times daily. 07/14/22 11/30/22 Yes Pilar Jarvis, MD  sucralfate (CARAFATE) 1 GM/10ML suspension Take 10 mLs (1 g total) by mouth  4 (four) times daily. 11/30/22  Yes Irean Hong, MD  acetaminophen (TYLENOL) 500 MG tablet Take 500 mg by mouth every 6 (six) hours as needed (for pain.).    [provider]  albuterol (VENTOLIN HFA) 108 (90 Base) MCG/ACT inhaler Inhale 2 puffs into the lungs every 6 (six) hours as needed. 10/21/19   [provider]  azithromycin (ZITHROMAX) 250 MG tablet Take 1 tablet (250 mg total) by mouth daily. Take first 2 tablets together, then 1 every day until finished. Patient not taking: Reported on 11/30/2022 02/09/22    Mickie Bail, NP  LORazepam (ATIVAN) 0.5 MG tablet Take 1 tablet (0.5 mg total) by mouth every 12 (twelve) hours as needed for anxiety. Patient not taking: Reported on 11/30/2022 01/31/20   Conard Novak, MD  ondansetron [facility]) 4 MG tablet Take 1 tablet (4 mg total) by mouth daily as needed for nausea or vomiting. 07/14/22 07/14/23  Pilar Jarvis, MD  oxybutynin (DITROPAN) 5 MG tablet Take 1 tablet (5 mg total) by mouth every 8 (eight) hours as needed for bladder spasms. 07/26/19   Vanna Scotland, MD  sertraline (ZOLOFT) 100 MG tablet Take 1 tablet (100 mg total) by mouth daily. Patient not taking: Reported on 11/30/2022 01/12/21   Conard Novak, MD  sodium chloride (OCEAN) 0.65 % SOLN nasal spray Place 1-2 sprays into both nostrils 4 (four) times daily as needed for congestion.    [provider]     Allergies  Bee venom, Other, and Tamiflu [oseltamivir phosphate]   Family History   Family History  Problem Relation Age of Onset   Breast cancer Mother 11   Ovarian cancer Mother 61   Heart attack Mother    Endometrial cancer Maternal Grandmother 73   Lung cancer Maternal Grandfather 58   Lung cancer Paternal Grandfather 108   Bladder Cancer Cousin      Physical Exam  Triage Vital Signs: ED Triage Vitals  Encounter Vitals Group     BP 11/30/22 0054 (!) 145/64     Systolic BP Percentile --      Diastolic BP Percentile --      Pulse Rate 11/30/22 0054 84     Resp 11/30/22 0054 16     Temp 11/30/22 0054 98.6 F (37 C)     Temp Source 11/30/22 0054 Oral     SpO2 11/30/22 0054 100 %     Weight --      Height 11/30/22 0047 5\' 6"  (1.676 m)     Head Circumference --      Peak Flow --      Pain Score 11/30/22 0047 9     Pain Loc --      Pain Education --      Exclude from Growth Chart --     Updated Vital Signs: BP (!) 144/64 (BP Location: Right Arm)   Pulse 85   Temp 98.6 F (37 C) (Oral)   Resp 18   Ht 5\' 6"  (1.676 m)   LMP 03/12/2019 (Exact Date)   SpO2  100%   BMI 40.35 kg/m    General: Awake, mild distress.  Appears uncomfortable. CV:  RRR.  Good peripheral perfusion.  Resp:  Normal effort.  CTAB. Abd:  Mildly tender to palpation right upper quadrant without rebound or guarding.  No distention.  Other:  No truncal vesicles.   ED Results / Procedures / Treatments  Labs (all labs ordered are listed, but only abnormal results are  displayed) Labs Reviewed  BASIC METABOLIC PANEL - Abnormal; Notable for the following components:      Result Value   Glucose, Bld 170 (*)    Calcium 8.8 (*)    All other components within normal limits  HEPATIC FUNCTION PANEL - Abnormal; Notable for the following components:   Indirect Bilirubin 0.2 (*)    All other components within normal limits  CBC  LIPASE, BLOOD  D-DIMER, QUANTITATIVE  TROPONIN I (HIGH SENSITIVITY)  TROPONIN I (HIGH SENSITIVITY)     EKG  ED ECG REPORT I, Sheela Mcculley J, the attending physician, personally viewed and interpreted this ECG.   Date: 11/30/2022  EKG Time: 0050  Rate: 85  Rhythm: normal sinus rhythm  Axis: Normal  Intervals:none  ST&T Change: Nonspecific    RADIOLOGY I have independently visualized and interpreted patient's x-ray and ultrasound as well as noted the radiology interpretation:  Chest x-ray: No acute cardiopulmonary process  Ultrasound: Hepatic steatosis  Official radiology report(s): US ABDOMEN LIMITED RUQ (LIVER/GB)  Result Date: 11/30/2022 CLINICAL DATA:  Right upper quadrant pain x1 day. EXAM: ULTRASOUND ABDOMEN LIMITED RIGHT UPPER QUADRANT COMPARISON:  To July 14, 2022 FINDINGS: Gallbladder: No gallstones or wall thickening visualized (2.2 mm). No sonographic Murphy sign noted by sonographer. Common bile duct: Diameter: 8.5 mm Liver: No focal lesion identified. Diffusely increased echogenicity of the liver parenchyma is noted. Portal vein is patent on color Doppler imaging with normal direction of blood flow towards the liver. Other:  None. IMPRESSION: Hepatic steatosis without focal liver lesions. Electronically Signed   By: Aram Candela M.D.   On: 11/30/2022 02:26   DG Chest 2 View  Result Date: 11/30/2022 CLINICAL DATA:  Pain under right breast radiating to back EXAM: CHEST - 2 VIEW COMPARISON:  Radiographs 02/09/2022 FINDINGS: Stable cardiomediastinal silhouette. Linear atelectasis or scarring right midlung. Otherwise no focal consolidation. No pleural effusion or pneumothorax. No displaced rib fractures. IMPRESSION: No active cardiopulmonary disease. Electronically Signed   By: Minerva Fester M.D.   On: 11/30/2022 01:18     PROCEDURES:  Critical Care performed: No  .1-3 Lead EKG Interpretation  Performed by: Irean Hong, MD Authorized by: Irean Hong, MD     Interpretation: normal     ECG rate:  80   ECG rate assessment: normal     Rhythm: sinus rhythm     Ectopy: none     Conduction: normal   Comments:     Patient placed on cardiac monitor to evaluate for arrhythmias    MEDICATIONS ORDERED IN ED: Medications  sodium chloride 0.9 % bolus 500 mL (0 mLs Intravenous Stopped 11/30/22 0238)  ondansetron (ZOFRAN) injection 4 mg (4 mg Intravenous Given 11/30/22 0207)  famotidine (PEPCID) IVPB 20 mg premix (0 mg Intravenous Stopped 11/30/22 0238)  HYDROmorphone (DILAUDID) injection 0.5 mg (0.5 mg Intravenous Given 11/30/22 0207)     IMPRESSION / MDM / ASSESSMENT AND PLAN / ED COURSE  I reviewed the triage vital signs and the nursing notes.                             42 year old female presenting with chest/upper abdominal pain. Differential diagnosis includes, but is not limited to, biliary disease (biliary colic, acute cholecystitis, cholangitis, choledocholithiasis, etc), intrathoracic causes for epigastric abdominal pain including ACS, gastritis, duodenitis, pancreatitis, small bowel or large bowel obstruction, abdominal aortic aneurysm, hernia, and ulcer(s).  I personally reviewed patient's records and  note a  GI visit on 11/15/2022 for GERD.  Patient's presentation is most consistent with acute presentation with potential threat to life or bodily function.  The patient is on the cardiac monitor to evaluate for evidence of arrhythmia and/or significant heart rate changes.  Laboratory results demonstrate normal WBC of 7, unremarkable electrolytes including LFTs/lipase, initial troponin is negative, chest x-ray is clear.  Normal EKG.  Clinically suspicious for gallbladder disease.  Will obtain right upper quadrant abdominal ultrasound.  Keep NPO, initiate IV fluids, Dilaudid and Zofran for pain and to prevent nausea, IV Pepcid for history of GERD.  Will reassess.  Clinical Course as of 11/30/22 0518  Sat Nov 30, 2022  0251 US demonstrates hepatic steatosis.  Will repeat troponin, check D-dimer.  Patient more comfortable at this time. [JS]  0404 Patient resting in no acute distress, feeling better.  Updated her on negative repeat troponin and negative D-dimer.  She is taking omeprazole only at this time.  Will add Carafate and Pepcid, refer to cardiology for outpatient follow-up.  Strict return precautions given.  Patient verbalizes understanding and agrees with plan of care. [JS]    Clinical Course User Index [JS] Irean Hong, MD     FINAL CLINICAL IMPRESSION(S) / ED DIAGNOSES   Final diagnoses:  Nonspecific chest pain  Gastroesophageal reflux disease, unspecified whether esophagitis present     Rx / DC Orders   ED Discharge Orders          Ordered    sucralfate (CARAFATE) 1 GM/10ML suspension  4 times daily        11/30/22 0406    famotidine (PEPCID) 20 MG tablet  2 times daily        11/30/22 0406    Ambulatory referral to Cardiology       Comments: If you have not heard from the Cardiology office within the next 72 hours please call [phone].   11/30/22 0406             Note:  This document was prepared using Dragon voice recognition software and may include  unintentional dictation errors.   Irean Hong, MD 11/30/22 [phone]

## 2022-11-30 NOTE — Discharge Instructions (Signed)
Continue Omeprazole as directed by your doctor.  Add Pepcid 20mg  twice daily and Carafate 3 times daily with meals and at bedtime.  Eat a bland diet.  Return to the ER for worsening symptoms, persistent vomiting, difficulty breathing or other concerns.

## 2022-11-30 NOTE — ED Triage Notes (Addendum)
Pt to ed from home via POV for "pain under my right breast that radiates into my back. I took a muscle relaxer with no relief". Pt is caox4, in no acute distress and ambulatory in triage. Pt has pain upon palpation in her chest area.

## 2023-05-29 ENCOUNTER — Emergency Department: Payer: Self-pay

## 2023-05-29 ENCOUNTER — Emergency Department
Admission: EM | Admit: 2023-05-29 | Discharge: 2023-05-29 | Disposition: A | Discharge status: Home / Self Care | Payer: Self-pay | Attending: Emergency Medicine | Admitting: Emergency Medicine

## 2023-05-29 ENCOUNTER — Other Ambulatory Visit: Payer: Self-pay

## 2023-05-29 DIAGNOSIS — J209 Acute bronchitis, unspecified: Secondary | ICD-10-CM | POA: Insufficient documentation

## 2023-05-29 DIAGNOSIS — R0789 Other chest pain: Secondary | ICD-10-CM

## 2023-05-29 DIAGNOSIS — E119 Type 2 diabetes mellitus without complications: Secondary | ICD-10-CM | POA: Insufficient documentation

## 2023-05-29 LAB — CBC
HCT: 38.9 % (ref 36.0–46.0)
Hemoglobin: 12.4 g/dL (ref 12.0–15.0)
MCH: 27.4 pg (ref 26.0–34.0)
MCHC: 31.9 g/dL (ref 30.0–36.0)
MCV: 85.9 fL (ref 80.0–100.0)
Platelets: 283 10*3/uL (ref 150–400)
RBC: 4.53 MIL/uL (ref 3.87–5.11)
RDW: 14.2 % (ref 11.5–15.5)
WBC: 9 10*3/uL (ref 4.0–10.5)
nRBC: 0 % (ref 0.0–0.2)

## 2023-05-29 LAB — HEPATIC FUNCTION PANEL
ALT: 30 U/L (ref 0–44)
AST: 33 U/L (ref 15–41)
Albumin: 3.9 g/dL (ref 3.5–5.0)
Alkaline Phosphatase: 70 U/L (ref 38–126)
Bilirubin, Direct: 0.2 mg/dL (ref 0.0–0.2)
Indirect Bilirubin: 0.4 mg/dL (ref 0.3–0.9)
Total Bilirubin: 0.6 mg/dL (ref 0.0–1.2)
Total Protein: 7.6 g/dL (ref 6.5–8.1)

## 2023-05-29 LAB — BASIC METABOLIC PANEL
Anion gap: 11 (ref 5–15)
BUN: 8 mg/dL (ref 6–20)
CO2: 25 mmol/L (ref 22–32)
Calcium: 9 mg/dL (ref 8.9–10.3)
Chloride: 105 mmol/L (ref 98–111)
Creatinine, Ser: 0.78 mg/dL (ref 0.44–1.00)
GFR, Estimated: >60 mL/min (ref >60)
Glucose, Bld: 115 mg/dL — ABNORMAL HIGH (ref 70–99)
Potassium: 3.7 mmol/L (ref 3.5–5.1)
Sodium: 141 mmol/L (ref 135–145)

## 2023-05-29 LAB — TROPONIN I (HIGH SENSITIVITY)
Troponin I (High Sensitivity): <2 ng/L (ref <18)
Troponin I (High Sensitivity): 3 ng/L (ref <18)

## 2023-05-29 LAB — LIPASE, BLOOD: Lipase: 28 U/L (ref 11–51)

## 2023-05-29 MED ORDER — ALBUTEROL SULFATE HFA 108 (90 BASE) MCG/ACT IN AERS
2.0000 | INHALATION_SPRAY | RESPIRATORY_TRACT | 0 refills | Status: AC | PRN
Start: 2023-05-29 — End: ?

## 2023-05-29 MED ORDER — IOHEXOL 350 MG/ML SOLN
100.0000 mL | Freq: Once | INTRAVENOUS | Status: AC | PRN
  Administered 2023-05-29: 100 mL via INTRAVENOUS

## 2023-05-29 NOTE — Discharge Instructions (Signed)
The CT scan is showing that you may have some bronchitis or inflammation in the lungs.  Try the albuterol inhaler up to every 4-6 hours for the next several days.  Take ibuprofen or Tylenol as needed for the pain.  Follow-up with your primary care provider.  Return to the ER for new, worsening, or persistent severe pain, difficulty breathing, or any other new or worsening symptoms that concern you.

## 2023-05-29 NOTE — ED Provider Triage Note (Signed)
Emergency Medicine Provider Triage Evaluation Note  Valerie Massey , a 43 y.o. female  was evaluated in triage.  Pt complains of chest pain that is started today, it radiates to the right neck and to the back.   Review of Systems  Positive:  Negative:   Physical Exam  BP (!) 172/87   Pulse 86   Temp 98 F (36.7 C) (Oral)   Resp 20   LMP 03/12/2019 (Exact Date)   SpO2 98%  Gen:   Awake, no distress   Resp:  Normal effort no wheezing MSK:   Moves extremities without difficulty  Other:    Medical Decision Making  Medically screening exam initiated at 5:32 PM.  Appropriate orders placed.  Valerie Massey was informed that the remainder of the evaluation will be completed by another provider, this initial triage assessment does not replace that evaluation, and the importance of remaining in the ED until their evaluation is complete.  Patient with chest pain we will do CBC CMP chest x-ray EKG troponins   Gladys Damme, PA-C 05/29/23 1733

## 2023-05-29 NOTE — ED Triage Notes (Signed)
Pt to ED via POV from home. Pt reports centralized CP with radiation to her back and right side of neck that started yesterday and went away. Pt reports today started having the same pain again associated with SOB. Pt denies cardiac hx but reports family hx. Pt also reports mild HA and light headed.

## 2023-05-29 NOTE — ED Provider Notes (Signed)
[facility] Provider Note    Event Date/Time   First MD Initiated Contact with Patient 05/29/23 1939     (approximate)   History   Chest Pain   HPI  Valerie Massey is a 43 y.o. female with a history of anemia, diabetes, esophagitis, and GERD who presents with chest pain, acute onset last night, in the central part of her chest, but rating straight through towards her back.  She states that initially happened during the night last night, then resolved, and then reoccurred today and has been persistent throughout the day.  She denies associated shortness of breath or lightheadedness.  She has no leg swelling.  She denies any prior history of pain like this; she does report a history of GERD but states that that has felt somewhat different in the past.  She has no cough or fever.  I reviewed the past medical records.  The patient's most recent outpatient encounter was with internal medicine on 12/19 of last year for acute bronchitis.   Physical Exam   Triage Vital Signs: ED Triage Vitals  Encounter Vitals Group     BP 05/29/23 1729 (!) 172/87     Systolic BP Percentile --      Diastolic BP Percentile --      Pulse Rate 05/29/23 1729 86     Resp 05/29/23 1729 20     Temp 05/29/23 1729 98 F (36.7 C)     Temp Source 05/29/23 1729 Oral     SpO2 05/29/23 1729 98 %     Weight --      Height --      Head Circumference --      Peak Flow --      Pain Score 05/29/23 1730 6     Pain Loc --      Pain Education --      Exclude from Growth Chart --     Most recent vital signs: Vitals:   05/29/23 2100 05/29/23 2200  BP: (!) 140/75 128/74  Pulse: 77 76  Resp: 19   Temp:    SpO2: 98% 96%     General: Awake, no distress.  CV:  Good peripheral perfusion.  Resp:  Normal effort.  Lungs CTAB. Abd:  No distention.  Other:  No calf popliteal swelling or tenderness.   ED Results / Procedures / Treatments   Labs (all labs ordered are listed, but  only abnormal results are displayed) Labs Reviewed  BASIC METABOLIC PANEL - Abnormal; Notable for the following components:      Result Value   Glucose, Bld 115 (*)    All other components within normal limits  CBC  HEPATIC FUNCTION PANEL  LIPASE, BLOOD  TROPONIN I (HIGH SENSITIVITY)  TROPONIN I (HIGH SENSITIVITY)     EKG  ED ECG REPORT I, Dionne Bucy, the attending physician, personally viewed and interpreted this ECG.  Date: 05/29/2023 EKG Time: 1729 Rate: 93 Rhythm: normal sinus rhythm QRS Axis: normal Intervals: normal ST/T Wave abnormalities: Nonspecific T wave abnormalities Narrative Interpretation: no evidence of acute ischemia    RADIOLOGY  Chest x-ray: I independently viewed and interpreted the images; there is no focal consolidation or edema  CT angio chest/abdomen/pelvis:   IMPRESSION:  1. Normal contour and caliber of the thoracic and abdominal aorta.  No evidence of aneurysm, dissection, or other acute aortic  pathology. No significant atherosclerosis.  2. Mild diffuse bilateral bronchial wall thickening, consistent with  nonspecific infectious or  inflammatory bronchitis.  3. Hepatomegaly and hepatic steatosis.  4. Status post hysterectomy.    PROCEDURES:  Critical Care performed: No  Procedures   MEDICATIONS ORDERED IN ED: Medications  iohexol (OMNIPAQUE) 350 MG/ML injection 100 mL (100 mLs Intravenous Contrast Given 05/29/23 2042)     IMPRESSION / MDM / ASSESSMENT AND PLAN / ED COURSE  I reviewed the triage vital signs and the nursing notes.  43 year old female with PMH as noted above presents with somewhat atypical chest pain initially occurring last night and then persisting throughout the day today, and radiating to her back.  On exam the patient is overall relatively well-appearing.  She is hypertensive with otherwise normal vital signs.  Physical exam is otherwise unremarkable.  EKG is nonischemic.  Initial troponin is  negative.  BMP and CBC show no acute findings.  Chest x-ray is unremarkable.  Differential diagnosis includes, but is not limited to, musculoskeletal pain, GERD, ACS, less likely aortic dissection or other vascular etiology, versus possible pancreatitis or other hepatobiliary cause.  There is a low likelihood for PE given the lack of tachycardia or hypoxia.  I have added on LFTs and lipase.  Given the radiation to the back and the significant hypertension initially, we will obtain a CT angio to rule out dissection.  Patient's presentation is most consistent with acute presentation with potential threat to life or bodily function.  ----------------------------------------- 9:50 PM on 05/29/2023 -----------------------------------------  CT is negative for acute vascular findings although there may be some element of bronchitis.  At this time given the negative workup the patient is stable for discharge home.  I counseled her on the results of workup and plan of care.  I have prescribed albuterol to treat the bronchitis.  I gave strict return precautions and she expressed understanding.  FINAL CLINICAL IMPRESSION(S) / ED DIAGNOSES   Final diagnoses:  Atypical chest pain  Acute bronchitis, unspecified organism     Rx / DC Orders   ED Discharge Orders          Ordered    albuterol (VENTOLIN HFA) 108 (90 Base) MCG/ACT inhaler  Every 4 hours PRN        05/29/23 2149             Note:  This document was prepared using Dragon voice recognition software and may include unintentional dictation errors.    Dionne Bucy, MD 05/29/23 2321

## 2023-12-25 ENCOUNTER — Emergency Department: Payer: Self-pay

## 2023-12-25 ENCOUNTER — Encounter: Payer: Self-pay | Admitting: Emergency Medicine

## 2023-12-25 ENCOUNTER — Emergency Department
Admission: EM | Admit: 2023-12-25 | Discharge: 2023-12-25 | Discharge status: Against Medical Advice | Payer: Self-pay | Attending: Emergency Medicine | Admitting: Emergency Medicine

## 2023-12-25 ENCOUNTER — Other Ambulatory Visit: Payer: Self-pay

## 2023-12-25 DIAGNOSIS — Z5321 Procedure and treatment not carried out due to patient leaving prior to being seen by health care provider: Secondary | ICD-10-CM | POA: Insufficient documentation

## 2023-12-25 DIAGNOSIS — R079 Chest pain, unspecified: Secondary | ICD-10-CM | POA: Insufficient documentation

## 2023-12-25 LAB — BASIC METABOLIC PANEL WITH GFR
Anion gap: 11 (ref 5–15)
BUN: 12 mg/dL (ref 6–20)
CO2: 27 mmol/L (ref 22–32)
Calcium: 9.4 mg/dL (ref 8.9–10.3)
Chloride: 101 mmol/L (ref 98–111)
Creatinine, Ser: 0.75 mg/dL (ref 0.44–1.00)
GFR, Estimated: >60 mL/min (ref >60)
Glucose, Bld: 84 mg/dL (ref 70–99)
Potassium: 3.8 mmol/L (ref 3.5–5.1)
Sodium: 139 mmol/L (ref 135–145)

## 2023-12-25 LAB — TROPONIN I (HIGH SENSITIVITY): Troponin I (High Sensitivity): 3 ng/L (ref <18)

## 2023-12-25 LAB — CBC
HCT: 39.2 % (ref 36.0–46.0)
Hemoglobin: 12.8 g/dL (ref 12.0–15.0)
MCH: 27.8 pg (ref 26.0–34.0)
MCHC: 32.7 g/dL (ref 30.0–36.0)
MCV: 85.2 fL (ref 80.0–100.0)
Platelets: 321 K/uL (ref 150–400)
RBC: 4.6 MIL/uL (ref 3.87–5.11)
RDW: 15 % (ref 11.5–15.5)
WBC: 10.7 K/uL — ABNORMAL HIGH (ref 4.0–10.5)
nRBC: 0 % (ref 0.0–0.2)

## 2023-12-25 NOTE — ED Triage Notes (Signed)
 Pt presents to the ED via POV with complaints of CP that started tonight around 1700. She notes that the pain radiates towards her jaw. No meds taken PTA - rates pain 4/10. A&Ox4 at this time. Denies SOB.

## 2024-02-28 IMAGING — MG MM DIGITAL SCREENING BILAT W/ TOMO AND CAD
8 series · 8 of 24 positions shown · non-contrast
Comparison: Previous exam(s).

ACR Breast Density Category a: The breast tissue is almost entirely
fatty.

CLINICAL DATA: Screening.

EXAM:
DIGITAL SCREENING BILATERAL MAMMOGRAM WITH TOMOSYNTHESIS AND CAD
TECHNIQUE: Bilateral screening digital craniocaudal and mediolateral oblique
mammograms were obtained. Bilateral screening digital breast
tomosynthesis was performed. The images were evaluated with
computer-aided detection.

[L MLO synth-2D]
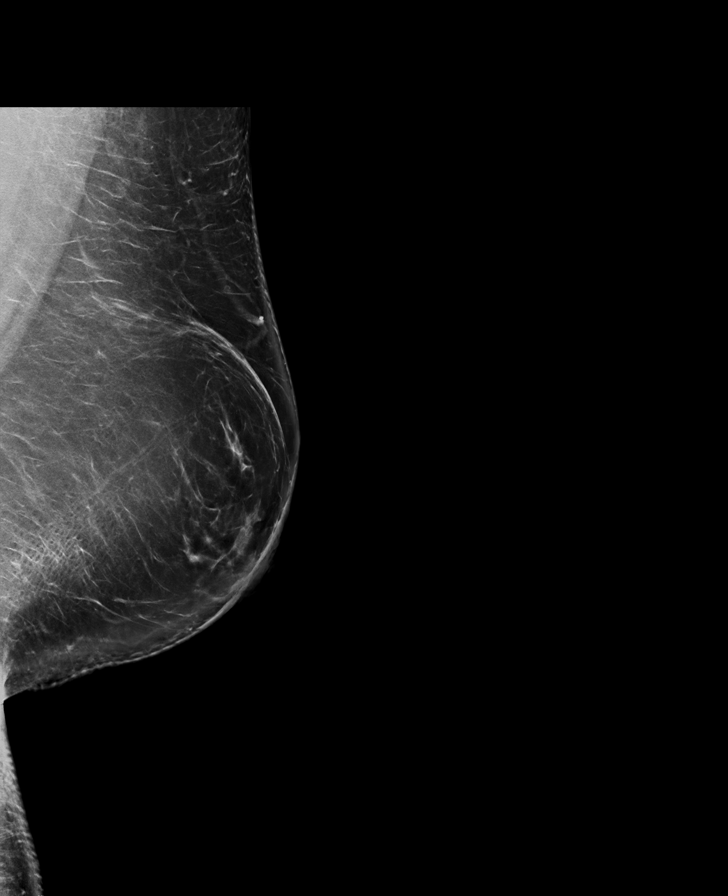

[L CC synth-2D]
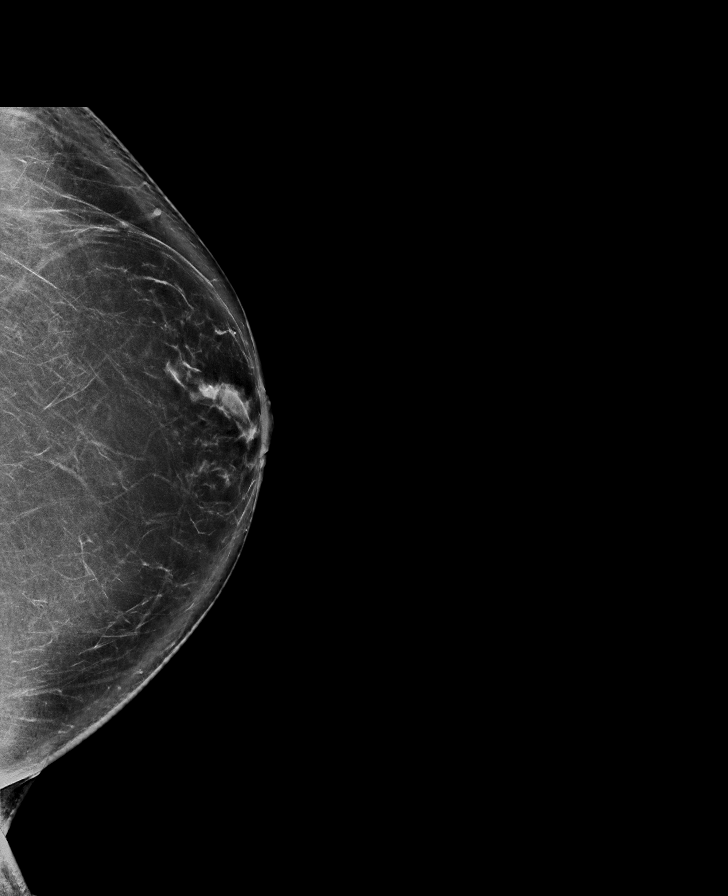

[R MLO synth-2D]
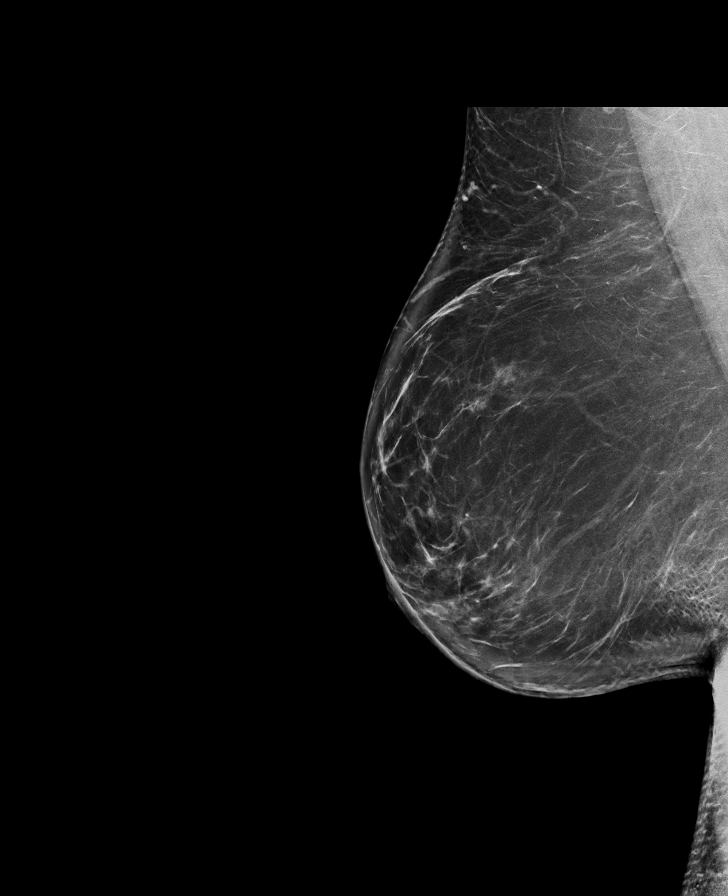

[R CC synth-2D]
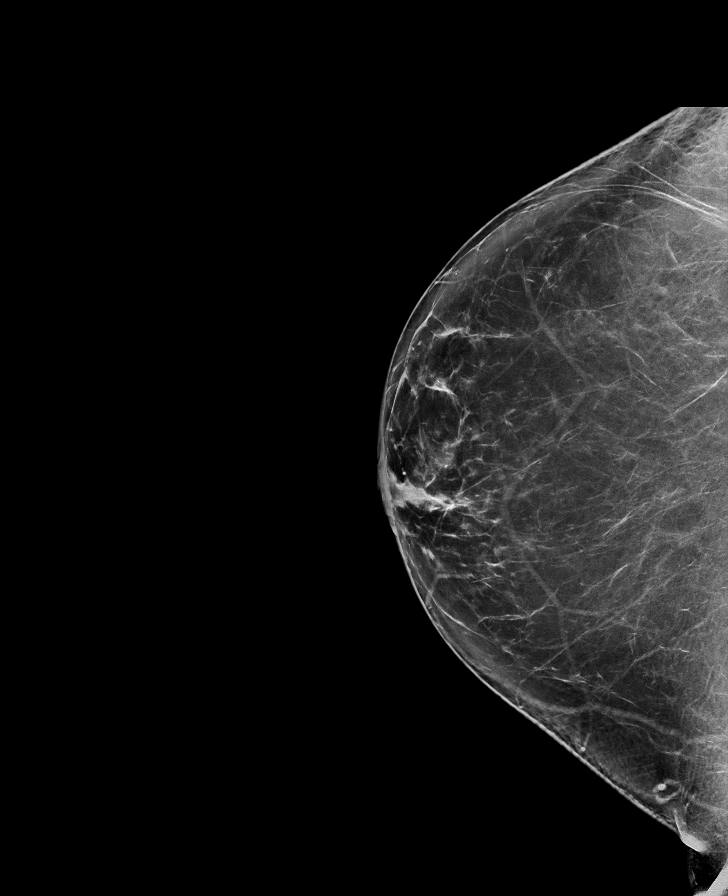

[R MLO tomo · tomo slice 51/102.0]
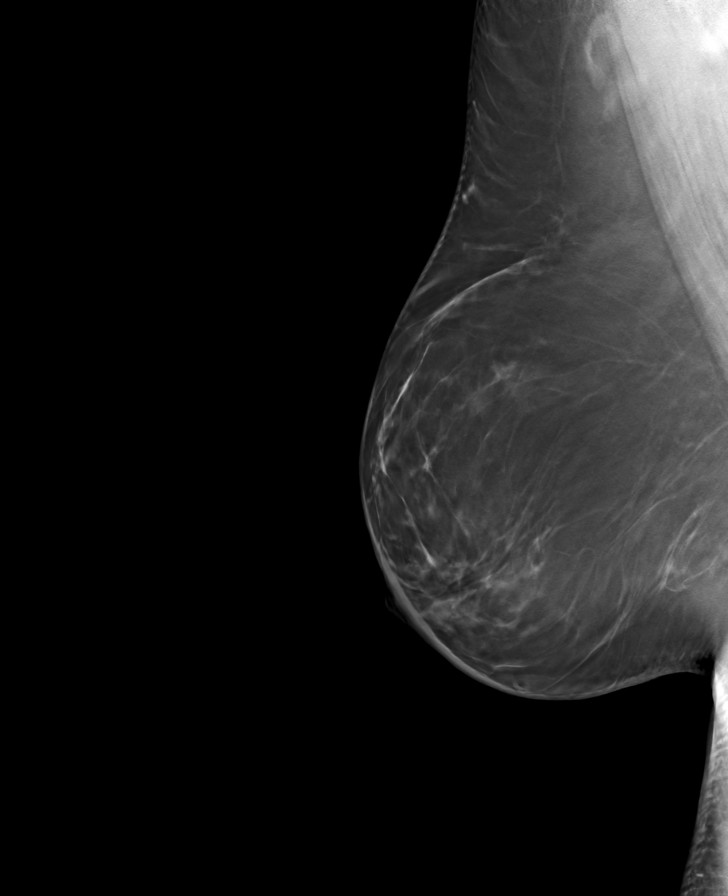

[L MLO tomo · tomo slice 53/104.0]
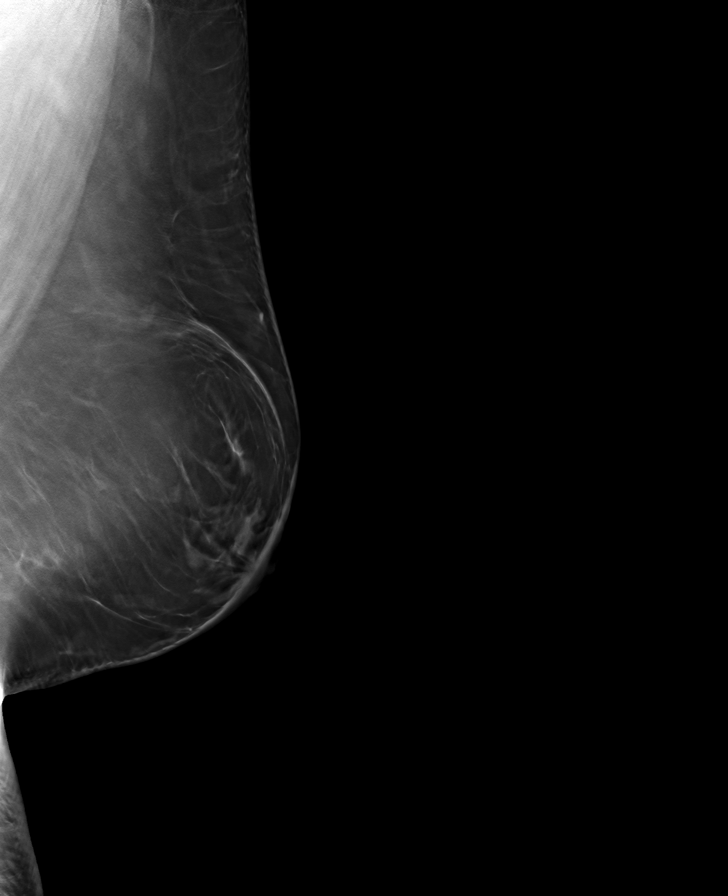

[R CC tomo · tomo slice 45/89.0]
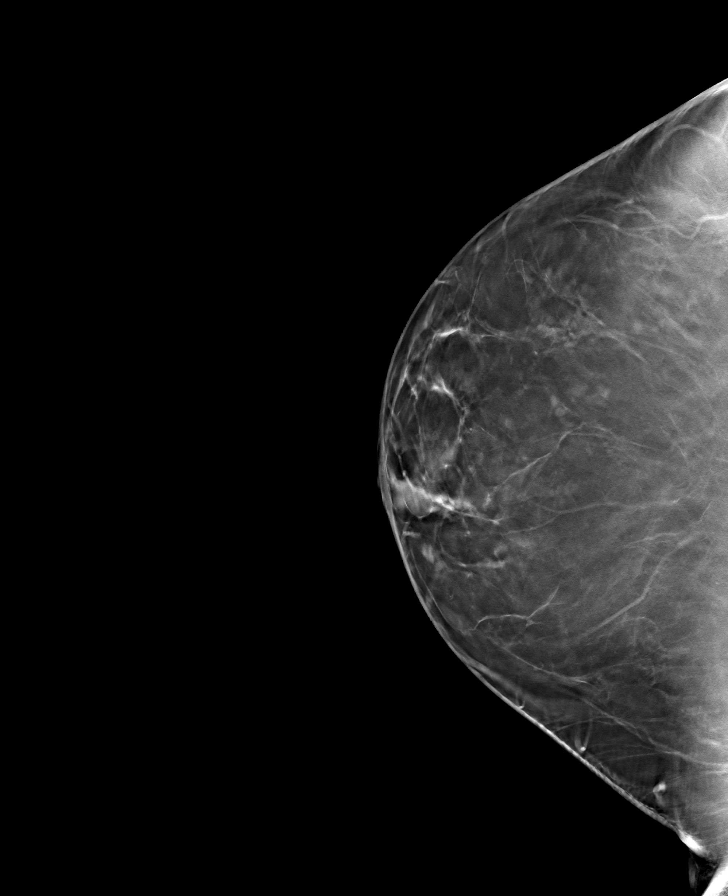

[L CC tomo · tomo slice 46/91.0]
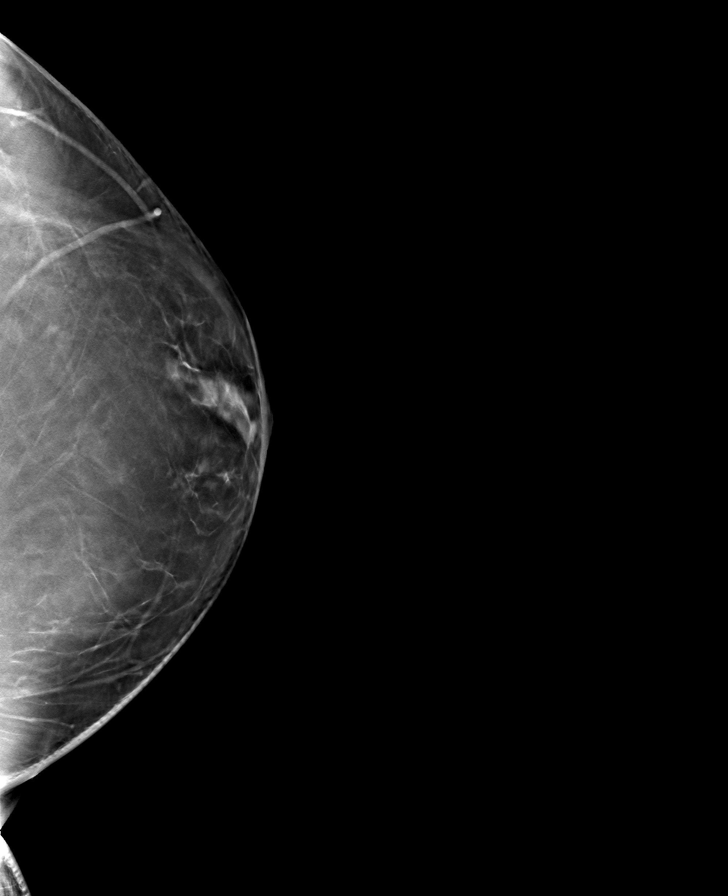

[8 of 24 positions shown; findings below may reference images not displayed]

FINDINGS: There are no findings suspicious for malignancy.
IMPRESSION: No mammographic evidence of malignancy. A result letter of this
screening mammogram will be mailed directly to the patient.

RECOMMENDATION:
Screening mammogram in one year. (Code:0E-3-N98)

BI-RADS CATEGORY  1: Negative.

## 2024-03-30 ENCOUNTER — Emergency Department: Admission: EM | Admit: 2024-03-30 | Discharge: 2024-03-30 | Disposition: A | Discharge status: Home / Self Care

## 2024-03-30 ENCOUNTER — Other Ambulatory Visit: Payer: Self-pay

## 2024-03-30 ENCOUNTER — Encounter: Payer: Self-pay | Admitting: *Deleted

## 2024-03-30 DIAGNOSIS — R140 Abdominal distension (gaseous): Secondary | ICD-10-CM | POA: Diagnosis not present

## 2024-03-30 DIAGNOSIS — H53149 Visual discomfort, unspecified: Secondary | ICD-10-CM | POA: Insufficient documentation

## 2024-03-30 DIAGNOSIS — R110 Nausea: Secondary | ICD-10-CM | POA: Diagnosis not present

## 2024-03-30 DIAGNOSIS — R519 Headache, unspecified: Secondary | ICD-10-CM | POA: Diagnosis present

## 2024-03-30 LAB — URINALYSIS, ROUTINE W REFLEX MICROSCOPIC
Bilirubin Urine: NEGATIVE
Glucose, UA: NEGATIVE mg/dL
Hgb urine dipstick: NEGATIVE
Ketones, ur: NEGATIVE mg/dL
Leukocytes,Ua: NEGATIVE
Nitrite: NEGATIVE
Protein, ur: NEGATIVE mg/dL
Specific Gravity, Urine: 1.026 (ref 1.005–1.030)
pH: 6 (ref 5.0–8.0)

## 2024-03-30 LAB — CBC
HCT: 41.9 % (ref 36.0–46.0)
Hemoglobin: 13.6 g/dL (ref 12.0–15.0)
MCH: 27.3 pg (ref 26.0–34.0)
MCHC: 32.5 g/dL (ref 30.0–36.0)
MCV: 84.1 fL (ref 80.0–100.0)
Platelets: 335 K/uL (ref 150–400)
RBC: 4.98 MIL/uL (ref 3.87–5.11)
RDW: 14.6 % (ref 11.5–15.5)
WBC: 9.1 K/uL (ref 4.0–10.5)
nRBC: 0 % (ref 0.0–0.2)

## 2024-03-30 LAB — COMPREHENSIVE METABOLIC PANEL WITH GFR
ALT: 18 U/L (ref 0–44)
AST: 24 U/L (ref 15–41)
Albumin: 4.5 g/dL (ref 3.5–5.0)
Alkaline Phosphatase: 79 U/L (ref 38–126)
Anion gap: 12 (ref 5–15)
BUN: 10 mg/dL (ref 6–20)
CO2: 25 mmol/L (ref 22–32)
Calcium: 9.3 mg/dL (ref 8.9–10.3)
Chloride: 103 mmol/L (ref 98–111)
Creatinine, Ser: 0.69 mg/dL (ref 0.44–1.00)
GFR, Estimated: >60 mL/min (ref >60)
Glucose, Bld: 87 mg/dL (ref 70–99)
Potassium: 4.2 mmol/L (ref 3.5–5.1)
Sodium: 139 mmol/L (ref 135–145)
Total Bilirubin: 0.2 mg/dL (ref 0.0–1.2)
Total Protein: 8.5 g/dL — ABNORMAL HIGH (ref 6.5–8.1)

## 2024-03-30 LAB — HCG, QUANTITATIVE, PREGNANCY: hCG, Beta Chain, Quant, S: <1 m[IU]/mL (ref <5)

## 2024-03-30 LAB — LIPASE, BLOOD: Lipase: 22 U/L (ref 11–51)

## 2024-03-30 LAB — TROPONIN T, HIGH SENSITIVITY: Troponin T High Sensitivity: <15 ng/L (ref 0–19)

## 2024-03-30 LAB — RESP PANEL BY RT-PCR (RSV, FLU A&B, COVID)  RVPGX2
Influenza A by PCR: NEGATIVE
Influenza B by PCR: NEGATIVE
Resp Syncytial Virus by PCR: NEGATIVE
SARS Coronavirus 2 by RT PCR: NEGATIVE

## 2024-03-30 MED ORDER — DIPHENHYDRAMINE HCL 50 MG/ML IJ SOLN
12.5000 mg | Freq: Once | INTRAMUSCULAR | Status: AC
  Administered 2024-03-30: 12.5 mg via INTRAVENOUS
  Filled 2024-03-30: qty 1

## 2024-03-30 MED ORDER — FAMOTIDINE 20 MG PO TABS: 20.0000 mg | ORAL_TABLET | Freq: Two times a day (BID) | ORAL | 0 refills | Status: AC

## 2024-03-30 MED ORDER — IBUPROFEN 200 MG PO TABS: 600.0000 mg | ORAL_TABLET | Freq: Three times a day (TID) | ORAL | 2 refills | Status: DC | PRN

## 2024-03-30 MED ORDER — METOCLOPRAMIDE HCL 10 MG PO TABS: 10.0000 mg | ORAL_TABLET | Freq: Three times a day (TID) | ORAL | 1 refills | Status: AC | PRN

## 2024-03-30 MED ORDER — ACETAMINOPHEN 500 MG PO TABS: 1000.0000 mg | ORAL_TABLET | Freq: Four times a day (QID) | ORAL | 2 refills | Status: AC | PRN

## 2024-03-30 MED ORDER — ACETAMINOPHEN 500 MG PO TABS: 1000.0000 mg | ORAL_TABLET | Freq: Four times a day (QID) | ORAL | 2 refills | Status: DC | PRN

## 2024-03-30 MED ORDER — LIDOCAINE VISCOUS HCL 2 % MT SOLN
15.0000 mL | Freq: Once | OROMUCOSAL | Status: AC
  Administered 2024-03-30: 15 mL via ORAL
  Filled 2024-03-30: qty 15

## 2024-03-30 MED ORDER — IBUPROFEN 200 MG PO TABS: 600.0000 mg | ORAL_TABLET | Freq: Three times a day (TID) | ORAL | 2 refills | Status: AC | PRN

## 2024-03-30 MED ORDER — FAMOTIDINE 20 MG PO TABS: 20.0000 mg | ORAL_TABLET | Freq: Two times a day (BID) | ORAL | 0 refills | Status: DC

## 2024-03-30 MED ORDER — SODIUM CHLORIDE 0.9 % IV BOLUS
500.0000 mL | Freq: Once | INTRAVENOUS | Status: AC
  Administered 2024-03-30: 500 mL via INTRAVENOUS

## 2024-03-30 MED ORDER — FAMOTIDINE 20 MG PO TABS
20.0000 mg | ORAL_TABLET | Freq: Once | ORAL | Status: AC
  Administered 2024-03-30: 20 mg via ORAL
  Filled 2024-03-30: qty 1

## 2024-03-30 MED ORDER — METOCLOPRAMIDE HCL 5 MG/ML IJ SOLN
10.0000 mg | Freq: Once | INTRAMUSCULAR | Status: AC
  Administered 2024-03-30: 10 mg via INTRAVENOUS
  Filled 2024-03-30: qty 2

## 2024-03-30 MED ORDER — ALUMINUM-MAGNESIUM-SIMETHICONE 200-200-20 MG/5ML PO SUSP: 30.0000 mL | Freq: Three times a day (TID) | ORAL | 0 refills | Status: AC

## 2024-03-30 MED ORDER — METOCLOPRAMIDE HCL 10 MG PO TABS: 10.0000 mg | ORAL_TABLET | Freq: Three times a day (TID) | ORAL | 1 refills | Status: DC | PRN

## 2024-03-30 MED ORDER — ALUMINUM-MAGNESIUM-SIMETHICONE 200-200-20 MG/5ML PO SUSP: 30.0000 mL | Freq: Three times a day (TID) | ORAL | 0 refills | Status: DC

## 2024-03-30 MED ORDER — ALUM & MAG HYDROXIDE-SIMETH 200-200-20 MG/5ML PO SUSP
30.0000 mL | Freq: Once | ORAL | Status: AC
  Administered 2024-03-30: 30 mL via ORAL
  Filled 2024-03-30: qty 30

## 2024-03-30 MED ORDER — KETOROLAC TROMETHAMINE 15 MG/ML IJ SOLN
15.0000 mg | Freq: Once | INTRAMUSCULAR | Status: AC
  Administered 2024-03-30: 15 mg via INTRAVENOUS
  Filled 2024-03-30: qty 1

## 2024-03-30 MED ORDER — ACETAMINOPHEN 500 MG PO TABS
1000.0000 mg | ORAL_TABLET | Freq: Once | ORAL | Status: AC
  Administered 2024-03-30: 1000 mg via ORAL
  Filled 2024-03-30: qty 2

## 2024-03-30 NOTE — Discharge Instructions (Addendum)
 Your evaluation in the emergency department was overall reassuring, and your headache improved with treatment.  Please follow-up with your primary care providers for reevaluation, and return to the emergency department with any new or worsening symptoms.  Continue to drink plenty of water daily, and I have prescribed a few medications to help with your headaches that you can use as needed.  I have also prescribed you an antacid medications for your bloating.

## 2024-03-30 NOTE — ED Triage Notes (Addendum)
 Pt to triage via wheelchair.  Pt sent from [facility] for eval of abd pain/bloating and headache.  Pt reports bloating for 4 days with nausea.  Pt has a headache that started  today at 1330.   No otc meds. Pt reports dizziness.     No chest pain or sob.   Pt alert  speech clear.

## 2024-03-30 NOTE — ED Notes (Signed)
 No ct scan per dr waymond at this time

## 2024-03-30 NOTE — ED Provider Notes (Signed)
 [facility] Provider Note    Event Date/Time   First MD Initiated Contact with Patient 03/30/24 1632     (approximate)   History   Headache and Abdominal Pain  Pt to triage via wheelchair.  Pt sent from [facility] for eval of abd pain/bloating and headache.  Pt reports bloating for 4 days with nausea.  Pt has a headache that started  today at 1330.   No otc meds. Pt reports dizziness.     No chest pain or sob.   Pt alert  speech clear.      HPI Valerie Massey is a 43 y.o. female PMH migraines, GERD, GAD, hiatal hernia, depression, anemia, bronchitis presents for evaluation of headache, nausea, abdominal bloating - Patient is primarily here because she has been having interval headaches over the past few days.  Feels they are somewhat more frequent over the past month or so.  No preceding trauma, not on blood thinners.  Most recent headache developed about 3 hours ago in the early afternoon, not thunderclap.  Some photophobia, phonophobia, nausea.  Similar to prior headaches though somewhat more severe in intensity.  Refractory to gabapentin which she has been on for about a month. -Separately notes she has been having a bloating sensation with some foul-smelling burps over the past few days.  No vomiting, no diarrhea.  Having regular bowel movements.  Denies any abdominal pain.  No urinary symptoms. - Went to clinic today but was sent to ED from the front desk, was not seen by provider there - Mother recently traveled to [place]  and has come back with URI symptoms.  Patient denies any cough, congestion, sore throat. - No neck pain or stiffness  Per chart review, does have a well-documented history of migraines.  Multiple prior CT heads, most recently in 2023 with no underlying pathology.      Physical Exam   Triage Vital Signs: ED Triage Vitals  Encounter Vitals Group     BP 03/30/24 1606 (!) 152/89     Girls Systolic BP Percentile --      Girls Diastolic BP  Percentile --      Boys Systolic BP Percentile --      Boys Diastolic BP Percentile --      Pulse Rate 03/30/24 1602 83     Resp 03/30/24 1602 18     Temp 03/30/24 1602 97.7 F (36.5 C)     Temp Source 03/30/24 1602 Oral     SpO2 03/30/24 1602 95 %     Weight 03/30/24 1603 243 lb (110.2 kg)     Height 03/30/24 1603 5' 6 (1.676 m)     Head Circumference --      Peak Flow --      Pain Score 03/30/24 1602 6     Pain Loc --      Pain Education --      Exclude from Growth Chart --     Most recent vital signs: Vitals:   03/30/24 1602 03/30/24 1606  BP:  (!) 152/89  Pulse: 83   Resp: 18   Temp: 97.7 F (36.5 C)   SpO2: 95%      General: Awake, no distress.  HEENT: Normocephalic, atraumatic, no meningismus CV:  Good peripheral perfusion. RRR, RP 2+ Resp:  Normal effort. CTAB Abd:  No distention. Nontender to deep palpation throughout Neuro:  Aox4, CN II-XII intact, FNF wnl, finger taps fast b/l, 5/5 strength in bilateral finger extension/grip, arm  flexion/extension, EHL/FHL. BUE AG 10+ sec no drift, BLE AG 5+ sec no drift. Ambulates with steady gait. SILT.     ED Results / Procedures / Treatments   Labs (all labs ordered are listed, but only abnormal results are displayed) Labs Reviewed  COMPREHENSIVE METABOLIC PANEL WITH GFR - Abnormal; Notable for the following components:      Result Value   Total Protein 8.5 (*)    All other components within normal limits  URINALYSIS, ROUTINE W REFLEX MICROSCOPIC - Abnormal; Notable for the following components:   Color, Urine YELLOW (*)    APPearance HAZY (*)    All other components within normal limits  RESP PANEL BY RT-PCR (RSV, FLU A&B, COVID)  RVPGX2  LIPASE, BLOOD  CBC  HCG, QUANTITATIVE, PREGNANCY  TROPONIN T, HIGH SENSITIVITY     EKG  See ED course below.   RADIOLOGY N/a    PROCEDURES:  Critical Care performed: No  Procedures   MEDICATIONS ORDERED IN ED: Medications  sodium chloride  0.9 % bolus 500  mL (0 mLs Intravenous Stopped 03/30/24 1815)  metoCLOPramide  (REGLAN ) injection 10 mg (10 mg Intravenous Given 03/30/24 1748)  diphenhydrAMINE  (BENADRYL ) injection 12.5 mg (12.5 mg Intravenous Given 03/30/24 1748)  ketorolac  (TORADOL ) 15 MG/ML injection 15 mg (15 mg Intravenous Given 03/30/24 1748)  alum & mag hydroxide-simeth (MAALOX/MYLANTA) 200-200-20 MG/5ML suspension 30 mL (30 mLs Oral Given 03/30/24 1746)    And  lidocaine  (XYLOCAINE ) 2 % viscous mouth solution 15 mL (15 mLs Oral Given 03/30/24 1746)  famotidine  (PEPCID ) tablet 20 mg (20 mg Oral Given 03/30/24 1740)  acetaminophen  (TYLENOL ) tablet 1,000 mg (1,000 mg Oral Given 03/30/24 1740)     IMPRESSION / MDM / ASSESSMENT AND PLAN / ED COURSE  I reviewed the triage vital signs and the nursing notes.                              DDX/MDM/AP: Differential diagnosis includes, but is not limited to, migraine headache, do not clinically suspect subarachnoid or other acute intracranial pathology with no red flags on history and reassuring neurologic exam here with known history of migraines.  With regard to nausea and abdominal bloating sensation, no clinical concern for acute intra-abdominal pathology with very benign abdominal exam here and no red flags on history.  Suspect some component of esophagitis/GERD which patient also has a history of.  Plan: - Labs - Reglan , Toradol , Benadryl , Tylenol , IV fluid - In shared decision making, patient is comfortable deferring any repeat CT head imaging at this time - GI cocktail - Reassess  Patient's presentation is most consistent with acute presentation with potential threat to life or bodily function.  The patient is on the cardiac monitor to evaluate for evidence of arrhythmia and/or significant heart rate changes.  ED course below.  Patient feeling much better after above management.  Workup reassuring.  Serial abdominal exams benign.  No evidence of acute pathology at this time.   Suspect migraine headache some vague mild GI discomfort though no concern for acute underlying abdominal pathology.  Rx Tylenol , Motrin , Reglan  as well as famotidine  and Maalox.  Plan for PMD follow-up.  ED return precautions in place.  Patient agrees with plan.  Clinical Course as of 03/30/24 1935  Tue Mar 30, 2024  1643 CBC reviewed, unremarkable  CMP and lipase reviewed, unremarkable  Trop wnl [MM]  1643 Ecg = sinus rhythm, rate 89, no gross ST elevation or  depression, no significant repolarization abnormality, normal axis, normal intervals.  No evidence of ischemia nor arrhythmia on my interpretation. [MM]  1846 UA w/ no e/o infxn [MM]  1904 Viral swab neg [MM]  1921 Patient reevaluated, headache notably improved, now 2/10.  Repeat abdominal exam with no tenderness to palpation throughout.  Minimal to discharge home.  No evidence of acute pathology at this time.  Plan for PMD follow-up.  ED return precautions in place.  Patient agrees with plan. [MM]    Clinical Course User Index [MM] Clarine Ozell LABOR, MD     FINAL CLINICAL IMPRESSION(S) / ED DIAGNOSES   Final diagnoses:  Nonintractable headache, unspecified chronicity pattern, unspecified headache type  Bloating     Rx / DC Orders   ED Discharge Orders          Ordered    acetaminophen  (TYLENOL ) 500 MG tablet  Every 6 hours PRN        03/30/24 1933    ibuprofen  (MOTRIN  IB) 200 MG tablet  Every 8 hours PRN        03/30/24 1933    metoCLOPramide  (REGLAN ) 10 MG tablet  Every 8 hours PRN        03/30/24 1933    aluminum -magnesium  hydroxide-simethicone  (MAALOX) 200-200-20 MG/5ML SUSP  3 times daily before meals & bedtime        03/30/24 1933    famotidine  (PEPCID ) 20 MG tablet  2 times daily        03/30/24 1933             Note:  This document was prepared using Dragon voice recognition software and may include unintentional dictation errors.   Clarine Ozell LABOR, MD 03/30/24 WINDELL
